# Patient Record
Sex: Male | Born: 1960 | Race: White | Hispanic: No | Marital: Single | Smoking: Current every day smoker
Health system: Southern US, Community
[De-identification: ages and names within clinical notes are randomized; demographics above are authoritative.]

## PROBLEM LIST (undated history)

## (undated) DIAGNOSIS — J449 Chronic obstructive pulmonary disease, unspecified: Secondary | ICD-10-CM

## (undated) DIAGNOSIS — K922 Gastrointestinal hemorrhage, unspecified: Secondary | ICD-10-CM

## (undated) DIAGNOSIS — Z86711 Personal history of pulmonary embolism: Secondary | ICD-10-CM

## (undated) DIAGNOSIS — I340 Nonrheumatic mitral (valve) insufficiency: Secondary | ICD-10-CM

## (undated) DIAGNOSIS — I428 Other cardiomyopathies: Secondary | ICD-10-CM

## (undated) DIAGNOSIS — I482 Chronic atrial fibrillation, unspecified: Secondary | ICD-10-CM

## (undated) DIAGNOSIS — I5022 Chronic systolic (congestive) heart failure: Secondary | ICD-10-CM

## (undated) DIAGNOSIS — I2510 Atherosclerotic heart disease of native coronary artery without angina pectoris: Secondary | ICD-10-CM

## (undated) DIAGNOSIS — I10 Essential (primary) hypertension: Secondary | ICD-10-CM

## (undated) DIAGNOSIS — F1910 Other psychoactive substance abuse, uncomplicated: Secondary | ICD-10-CM

## (undated) DIAGNOSIS — J45909 Unspecified asthma, uncomplicated: Secondary | ICD-10-CM

## (undated) DIAGNOSIS — Z86718 Personal history of other venous thrombosis and embolism: Secondary | ICD-10-CM

## (undated) DIAGNOSIS — E785 Hyperlipidemia, unspecified: Secondary | ICD-10-CM

## (undated) HISTORY — PX: APPENDECTOMY: SHX54

## (undated) HISTORY — PX: HERNIA REPAIR: SHX51

---

## 1997-07-16 ENCOUNTER — Encounter: Admission: RE | Admit: 1997-07-16 | Discharge: 1997-10-14 | Payer: Self-pay | Admitting: Internal Medicine

## 2004-09-21 ENCOUNTER — Emergency Department: Payer: Self-pay | Admitting: Emergency Medicine

## 2004-12-22 ENCOUNTER — Encounter: Payer: Self-pay | Admitting: Orthopedic Surgery

## 2005-01-07 ENCOUNTER — Encounter: Payer: Self-pay | Admitting: Orthopedic Surgery

## 2006-01-04 ENCOUNTER — Ambulatory Visit: Payer: Self-pay | Admitting: Pain Medicine

## 2006-01-17 ENCOUNTER — Ambulatory Visit: Payer: Self-pay | Admitting: Pain Medicine

## 2006-02-08 ENCOUNTER — Ambulatory Visit: Payer: Self-pay | Admitting: Pain Medicine

## 2006-02-13 ENCOUNTER — Ambulatory Visit: Payer: Self-pay | Admitting: Pain Medicine

## 2006-02-20 ENCOUNTER — Ambulatory Visit: Payer: Self-pay | Admitting: Pain Medicine

## 2006-02-21 ENCOUNTER — Ambulatory Visit: Payer: Self-pay | Admitting: Pain Medicine

## 2006-03-07 ENCOUNTER — Ambulatory Visit: Payer: Self-pay | Admitting: Physician Assistant

## 2006-03-21 ENCOUNTER — Ambulatory Visit: Payer: Self-pay | Admitting: Pain Medicine

## 2006-04-10 ENCOUNTER — Ambulatory Visit: Payer: Self-pay | Admitting: Pain Medicine

## 2006-04-20 ENCOUNTER — Ambulatory Visit: Payer: Self-pay | Admitting: Pain Medicine

## 2006-05-08 ENCOUNTER — Ambulatory Visit: Payer: Self-pay | Admitting: Physician Assistant

## 2006-05-23 ENCOUNTER — Ambulatory Visit: Payer: Self-pay | Admitting: Pain Medicine

## 2006-08-16 ENCOUNTER — Emergency Department: Payer: Self-pay

## 2006-08-29 ENCOUNTER — Ambulatory Visit: Payer: Self-pay | Admitting: General Surgery

## 2006-09-04 ENCOUNTER — Ambulatory Visit: Payer: Self-pay | Admitting: General Surgery

## 2010-02-23 ENCOUNTER — Emergency Department: Payer: Self-pay | Admitting: Internal Medicine

## 2011-06-06 ENCOUNTER — Emergency Department: Payer: Self-pay | Admitting: *Deleted

## 2011-06-06 LAB — CBC
MCH: 32.7 pg (ref 26.0–34.0)
MCHC: 33.6 g/dL (ref 32.0–36.0)
Platelet: 275 10*3/uL (ref 150–440)
RDW: 13.6 % (ref 11.5–14.5)

## 2011-06-06 LAB — COMPREHENSIVE METABOLIC PANEL
Alkaline Phosphatase: 62 U/L (ref 50–136)
Anion Gap: 8 (ref 7–16)
Bilirubin,Total: 0.7 mg/dL (ref 0.2–1.0)
Co2: 27 mmol/L (ref 21–32)
EGFR (Non-African Amer.): >60
Glucose: 83 mg/dL (ref 65–99)
Osmolality: 277 (ref 275–301)
Potassium: 4.1 mmol/L (ref 3.5–5.1)
SGOT(AST): 21 U/L (ref 15–37)
Sodium: 139 mmol/L (ref 136–145)
Total Protein: 6.4 g/dL (ref 6.4–8.2)

## 2011-06-06 LAB — CK TOTAL AND CKMB (NOT AT ARMC): CK, Total: 200 U/L (ref 35–232)

## 2011-08-04 DIAGNOSIS — R079 Chest pain, unspecified: Secondary | ICD-10-CM | POA: Insufficient documentation

## 2011-08-05 DIAGNOSIS — F17200 Nicotine dependence, unspecified, uncomplicated: Secondary | ICD-10-CM | POA: Insufficient documentation

## 2011-10-06 DIAGNOSIS — E785 Hyperlipidemia, unspecified: Secondary | ICD-10-CM | POA: Insufficient documentation

## 2011-10-06 DIAGNOSIS — I10 Essential (primary) hypertension: Secondary | ICD-10-CM | POA: Insufficient documentation

## 2011-10-06 DIAGNOSIS — I2510 Atherosclerotic heart disease of native coronary artery without angina pectoris: Secondary | ICD-10-CM | POA: Insufficient documentation

## 2012-07-28 ENCOUNTER — Inpatient Hospital Stay: Payer: Self-pay | Admitting: Family Medicine

## 2012-07-28 LAB — CBC WITH DIFFERENTIAL/PLATELET
Basophil #: 0.1 10*3/uL (ref 0.0–0.1)
Basophil %: 0.7 %
Eosinophil #: 0 10*3/uL (ref 0.0–0.7)
HGB: 15.3 g/dL (ref 13.0–18.0)
Lymphocyte %: 5.2 %
MCHC: 34.7 g/dL (ref 32.0–36.0)
MCV: 99 fL (ref 80–100)
Monocyte %: 7 %
Neutrophil #: 13.6 10*3/uL — ABNORMAL HIGH (ref 1.4–6.5)
Neutrophil %: 87 %
RDW: 13.3 % (ref 11.5–14.5)
WBC: 15.6 10*3/uL — ABNORMAL HIGH (ref 3.8–10.6)

## 2012-07-28 LAB — COMPREHENSIVE METABOLIC PANEL
Alkaline Phosphatase: 92 U/L (ref 50–136)
Bilirubin,Total: 0.8 mg/dL (ref 0.2–1.0)
Calcium, Total: 8.6 mg/dL (ref 8.5–10.1)
Co2: 20 mmol/L — ABNORMAL LOW (ref 21–32)
EGFR (African American): 57 — ABNORMAL LOW
Glucose: 120 mg/dL — ABNORMAL HIGH (ref 65–99)
Osmolality: 281 (ref 275–301)
Potassium: 3.5 mmol/L (ref 3.5–5.1)
SGPT (ALT): 33 U/L (ref 12–78)

## 2012-07-28 LAB — CK TOTAL AND CKMB (NOT AT ARMC)
CK, Total: 97 U/L (ref 35–232)
CK, Total: 82 U/L (ref 35–232)
CK, Total: 67 U/L (ref 35–232)
CK-MB: 0.9 ng/mL (ref 0.5–3.6)
CK-MB: 1 ng/mL (ref 0.5–3.6)

## 2012-07-28 LAB — TROPONIN I
Troponin-I: <0.02 ng/mL
Troponin-I: <0.02 ng/mL

## 2012-07-29 LAB — CBC WITH DIFFERENTIAL/PLATELET
Basophil #: 0 10*3/uL (ref 0.0–0.1)
Eosinophil %: 0 %
Lymphocyte #: 0.7 10*3/uL — ABNORMAL LOW (ref 1.0–3.6)
Lymphocyte %: 6.4 %
RBC: 4.37 10*6/uL — ABNORMAL LOW (ref 4.40–5.90)
RDW: 13.2 % (ref 11.5–14.5)

## 2012-07-29 LAB — BASIC METABOLIC PANEL
BUN: 30 mg/dL — ABNORMAL HIGH (ref 7–18)
Calcium, Total: 9.3 mg/dL (ref 8.5–10.1)
Chloride: 102 mmol/L (ref 98–107)
EGFR (African American): >60
EGFR (Non-African Amer.): >60
Osmolality: 283 (ref 275–301)
Potassium: 3.7 mmol/L (ref 3.5–5.1)

## 2012-07-29 LAB — LIPID PANEL
HDL Cholesterol: 92 mg/dL — ABNORMAL HIGH (ref 40–60)
Triglycerides: 88 mg/dL (ref 0–200)
VLDL Cholesterol, Calc: 18 mg/dL (ref 5–40)

## 2012-09-21 ENCOUNTER — Emergency Department: Payer: Self-pay | Admitting: Emergency Medicine

## 2012-09-21 LAB — CBC
HCT: 44.5 % (ref 40.0–52.0)
MCH: 36 pg — ABNORMAL HIGH (ref 26.0–34.0)
MCV: 102 fL — ABNORMAL HIGH (ref 80–100)
Platelet: 232 10*3/uL (ref 150–440)
RBC: 4.36 10*6/uL — ABNORMAL LOW (ref 4.40–5.90)
RDW: 14.6 % — ABNORMAL HIGH (ref 11.5–14.5)
WBC: 8.5 10*3/uL (ref 3.8–10.6)

## 2012-09-21 LAB — BASIC METABOLIC PANEL
Anion Gap: 4 — ABNORMAL LOW (ref 7–16)
Calcium, Total: 8.3 mg/dL — ABNORMAL LOW (ref 8.5–10.1)
Co2: 37 mmol/L — ABNORMAL HIGH (ref 21–32)
EGFR (African American): >60
Glucose: 119 mg/dL — ABNORMAL HIGH (ref 65–99)

## 2013-04-10 DIAGNOSIS — R0602 Shortness of breath: Secondary | ICD-10-CM | POA: Insufficient documentation

## 2013-04-29 DIAGNOSIS — M545 Low back pain, unspecified: Secondary | ICD-10-CM | POA: Insufficient documentation

## 2013-04-29 DIAGNOSIS — J449 Chronic obstructive pulmonary disease, unspecified: Secondary | ICD-10-CM | POA: Insufficient documentation

## 2014-05-30 NOTE — Discharge Summary (Signed)
PATIENT NAME:  Kirk Cortez, Kirk Cortez MR#:  [id number] DATE OF BIRTH:  06-25-1960  DATE OF ADMISSION:  07/28/2012 DATE OF DISCHARGE:  07/29/2012  ADMISSION DIAGNOSIS: Chronic obstructive pulmonary disease exacerbation.   FINAL DIAGNOSIS:  1.  Chronic obstructive pulmonary disease exacerbation.  2. Congestive heart failure, systolic and diastolic, mixed, compensated. No signs of exacerbation during this hospitalization.  3.  Chest pain, related to cough, musculoskeletal, noncardiac. 4.  Acute renal failure related to dehydration, now resolved.  5. Systemic inflammatory response syndrome with leukocytosis, tachycardia, tachypnea due to chronic obstructive pulmonary disease exacerbation. The patient on Levaquin.  6.  Hypertension, well-controlled.   IMPORTANT RESULTS: Glucose 149, BUN 30, creatinine 1.1 at discharge; 1.59 on admission. LDL 122, cholesterol 232, HDL of 92.   AST is less than 12. Troponin is negative x 3. White blood count on admission 15.6; at discharge 11.4. Hemoglobin 15.3.   ABGs showed a pO2 of 70% on admission, pCO2 of 26, and a pH of 7.47.   D-dimer 0.34.   Echo Doppler, as mentioned above the patient has mixed systolic and diastolic dysfunction. Ejection fraction is 45%, mildly increased global left ventricular systolic function with moderate increase of left ventricular internal cavity size and diastolic filling impairment on relaxation.   CHEST X-RAY: No acute pulmonary edema. No consolidations.   HOSPITAL COURSE: The patient was admitted for treatment of COPD exacerbation. He has a history of being a chronic smoker. He has congestive heart failure, current tobacco use. The patient was admitted 07/28/2012. He is being followed by [facility] for his congestive heart failure, and he has not been compliant with his medications as he keeps running out of them.   The patient reports that he has some right now and he is going to take them. He started wheezing for the past several  days, with a cough and sputum. He had elevation of white blood count, for which he was put on antibiotics and started on nebs and Solu-Medrol. The patient was off the oxygen quickly, and his oxygen saturation was brought above 92% on room air. He had an ABG with a pO2 of 70% on admission, but now that it is better after all of this treatment.   The patient wants to be discharged today. He does not want to stay any longer because he has to go to work. He does not have a veryy extenous job. He works for Scientist, product/process development for a band. He only has to do some connections. No heavy lifting, for which he wants to be discharged today. I told him that I would like to keep him for another day, but the patient seems to be insufferable at this moment, for which we are going to discharge him on Levaquin and a prednisone taper.   MEDICATIONS AT DISCHARGE: Lisinopril 10 mg once a day, furosemide 40 mg twice daily, Coreg 12.5 mg twice daily, prednisone taper, albuterol and ipratropium nebs. The patient has a nebulizer at home, continue 4 times a day for next 5 days, then p.r.n., levofloxacin 500 mg every 24 hours for 12 days, nicotine inhalation kit, given to the patient as well.   Follow up with [facility] in the next 1 to 2 weeks, and PCP at [facility] as well the next couple of weeks.   The patient discharged in good condition.   I spent about 45 minutes on this discharge.    ____________________________ Stilwell Sink, MD rsg:dm D: 07/29/2012 09:13:39 ET T: 07/29/2012 11:36:08 ET JOB#: [id number]  cc: Parlier Sink, MD, <Dictator> Aunesty Tyson America Brown MD ELECTRONICALLY SIGNED 08/08/2012 14:59

## 2014-05-30 NOTE — H&P (Signed)
PATIENT NAME:  Kirk Cortez, Kirk Cortez MR#:  [id number] DATE OF BIRTH:  1960/12/27  DATE OF ADMISSION:  07/28/2012  REFERRING PHYSICIAN: Lurena Joinerebecca L. Lord, MD  PRIMARY CARE PHYSICIAN: Currently having none, but he does [facility].   CHIEF COMPLAINT: Shortness of breath, chest pain.   HISTORY OF PRESENT ILLNESS: This is a 54 year old male with significant past medical history of COPD, CHF, hypertension, tobacco abuse, who presents with complaints of shortness of breath and chest pain. The patient reports he was diagnosed with CHF, where he has been [facility], but he reports the last time he saw the physician was before 8 months. As well, he reports he is noncompliant with medication as he ran out of most of his meds. The patient reported he has been having shortness of breath for the last few days, as well, he started to have cough with productive sputum, green in color. The patient had significant wheezing in ED, had ABG showing hypoxic respiratory failure. The patient's shortness of breath much improved after he received IV Solu-Medrol and nebulizer treatment. As well, the patient has been complaining of chest pain on the left side, nonradiating, describes it as a pressure, tightness quality. Denies any previous episodes of similar chest pain. Currently, reports he is chest pain free after his shortness of breath has improved. The patient's first troponin was negative. His EKG did not have any significant changes from the last one we had on our records. The patient's chest x-ray does not show any infiltrate. He denies any coffee-ground emesis, any melena, any palpitation, any diaphoresis, but has complaints of nausea, vomiting and diarrhea for 1 time earlier this week. As well, the patient was found to have elevated creatinine at 1.59. Reports he has been out of Lasix for 1 day. As well, he has been out of lisinopril for the last 2 weeks. He has been only taking carvedilol.  Hospitalist service was requested to admit the patient for further management and workup of his symptoms.   PAST MEDICAL HISTORY:  1. CHF.  2. COPD.  3. Hypertension.  4. Tobacco abuse.  5. History of noncompliance.   PAST SURGICAL HISTORY: Appendectomy.   SOCIAL HISTORY: The patient smokes 1 to 2 packs of cigarettes every day. Drinks alcohol, 1 pint of rum every other day. No illicit drug use Works as a Technical sales engineermusician.   FAMILY HISTORY: Significant for congestive heart failure in his father who died before 3 years, as well in one of his sisters.   ALLERGIES: No known drug allergies.   HOME MEDICATIONS:  1. Carvedilol 12.5 mg p.o. b.i.d.  2. Lisinopril 10 mg oral daily.  3. Lasix 40 mg oral 2 times a day.   REVIEW OF SYSTEMS:  CONSTITUTIONAL: The patient denies any fever, chills. Complains of generalized weakness and fatigue.  EYES: Denies blurry vision, double vision, pain, inflammation or glaucoma.  ENT: Denies any tinnitus, epistaxis or discharge, ear pain or hearing loss.  RESPIRATORY: Complains of cough, wheezing, COPD, dyspnea and green productive sputum.  CARDIOVASCULAR: Complains of chest pain. Denies edema, arrhythmia, palpitation or syncope.  GASTROINTESTINAL: Complains of nausea, vomiting and diarrhea, 1 episode, currently resolved. Denies abdominal pain, melena, coffee-ground emesis, rectal bleed or jaundice.  GENITOURINARY: Denies dysuria, hematuria or renal colic.  ENDOCRINE: Denies polyuria or polydipsia, heat or cold intolerance.  HEMATOLOGY: Denies anemia, easy bruising or bleeding diathesis.  INTEGUMENTARY: Denies acne, rash or skin lesions.  MUSCULOSKELETAL: Denies any gout, arthritis or cramps.  NEUROLOGIC:  Denies any CVA, TIA, ataxia, dementia, headache or seizures. PSYCHIATRIC: Denies anxiety, insomnia, schizophrenia, nervousness or bipolar disorder.   PHYSICAL EXAMINATION:  VITAL SIGNS: Pulse 102, respiratory rate 28, blood pressure 96/56, saturating 91% on  oxygen.  GENERAL: Well-nourished male, comfortable in bed, in no apparent distress.  HEENT: Head atraumatic, normocephalic. Pupils equal and reactive to light. Pink conjunctivae. Anicteric sclerae. Moist oral mucosa.  NECK: Supple. No thyromegaly. No JVD. No carotid bruits.  CHEST: The patient had fair air entry bilaterally with end-expiratory wheezing. No rales or rhonchi.  CARDIOVASCULAR: S1 and S2 heard. No rubs, murmurs or gallops.  ABDOMEN: Soft, nontender, nondistended. Bowel sounds present.  EXTREMITIES: No edema. No clubbing or cyanosis. Dorsalis pedis pulse +2 bilaterally.  SKIN: Normal skin turgor. Warm and dry.  PSYCHIATRIC: Appropriate affect. Awake, alert x3. Intact judgment and insight.  NEUROLOGIC: Cranial nerves grossly intact. Motor 5 out of 5. Sensory: No deficit.  LYMPHATICS: No cervical lymphadenopathy.  MUSCULOSKELETAL: No joint effusion.   PERTINENT LABORATORY DATA: Glucose 120, BUN 23, creatinine 1.59, sodium 138, potassium 3.5, chloride 104. Troponin less than 0.02. White blood cells 15.6, hemoglobin 15.3, hematocrit 44.2, platelets 236. D-dimer 0.34. ABG showing pH of 7.47, pCO2 of 26, pO2 of 70.   ASSESSMENT AND PLAN:  1. Shortness of breath. This is most likely related to chronic obstructive pulmonary disease exacerbation. Unlikely that this patient is having an acute exacerbation of his congestive heart failure as he does not appear to be in any volume overload. As well, his D-dimer is negative, so he is in acute hypoxic respiratory failure secondary to his chronic obstructive pulmonary disease exacerbation.  2. Chronic obstructive pulmonary disease exacerbation. Will start the patient on IV Solu-Medrol and nebulizers around the clock every 4 hours, DuoNebs, and p.r.n. albuterol every 2 hours, and given the fact that he is having productive sputum, green in color, will start him on levofloxacin.  3. Chest pain. Given that currently the patient is chest pain free without  any intervention and has negative troponins, no EKG changes and was given 324 mg of aspirin in the ED, will continue to cycle his cardiac enzymes, will have him on a telemetry monitor and will check a 2-D echo and lipid panel.  4. Acute renal failure. Will hold the patient's lisinopril, will hold his Lasix. Will continue him on gentle hydration. This is most likely prerenal, especially as the patient had an episode of vomiting and diarrhea causing his volume depletion.  5. Leukocytosis. The patient's chest x-ray does not have any infiltrate. Will check urinalysis.  6. Hypertension. Blood pressure actually is on the lower side, so will continue to hold his Lasix and lisinopril and continue him only on Coreg.  7. Tobacco abuse. The patient was counseled and will be started on NicoDerm patch.  8. Alcohol abuse. The patient will be started CIWA protocol.  9. Congestive heart failure, appears to be compensated. Unclear if it is diastolic or systolic. Will check 2-D echo.  10. Deep vein thrombosis prophylaxis. Subcutaneous heparin.   CODE STATUS: Full code.   TOTAL TIME SPENT ON ADMISSION AND PATIENT CARE: 55 minutes.   ____________________________ Starleen Arms, MD dse:OSi D: 07/28/2012 08:41:06 ET T: 07/28/2012 09:02:28 ET JOB#: [id number]  cc: Starleen Arms, MD, <Dictator> Jourdan Durbin Teena Irani MD ELECTRONICALLY SIGNED 07/29/2012 6:15

## 2014-09-01 ENCOUNTER — Other Ambulatory Visit: Payer: Self-pay

## 2014-09-01 ENCOUNTER — Telehealth: Payer: Self-pay

## 2014-09-01 ENCOUNTER — Emergency Department: Payer: Self-pay

## 2014-09-01 ENCOUNTER — Emergency Department
Admission: EM | Admit: 2014-09-01 | Discharge: 2014-09-01 | Disposition: A | Discharge status: Home / Self Care | Payer: Self-pay | Attending: Emergency Medicine | Admitting: Emergency Medicine

## 2014-09-01 ENCOUNTER — Encounter: Payer: Self-pay | Admitting: Emergency Medicine

## 2014-09-01 DIAGNOSIS — J441 Chronic obstructive pulmonary disease with (acute) exacerbation: Secondary | ICD-10-CM | POA: Insufficient documentation

## 2014-09-01 DIAGNOSIS — R079 Chest pain, unspecified: Secondary | ICD-10-CM

## 2014-09-01 DIAGNOSIS — Z720 Tobacco use: Secondary | ICD-10-CM | POA: Insufficient documentation

## 2014-09-01 DIAGNOSIS — I10 Essential (primary) hypertension: Secondary | ICD-10-CM | POA: Insufficient documentation

## 2014-09-01 DIAGNOSIS — I509 Heart failure, unspecified: Secondary | ICD-10-CM | POA: Insufficient documentation

## 2014-09-01 HISTORY — DX: Essential (primary) hypertension: I10

## 2014-09-01 HISTORY — DX: Chronic obstructive pulmonary disease, unspecified: J44.9

## 2014-09-01 LAB — BASIC METABOLIC PANEL
Anion gap: 6 (ref 5–15)
BUN: 11 mg/dL (ref 6–20)
CALCIUM: 8.9 mg/dL (ref 8.9–10.3)
CHLORIDE: 105 mmol/L (ref 101–111)
CO2: 25 mmol/L (ref 22–32)
Creatinine, Ser: 0.83 mg/dL (ref 0.61–1.24)
GFR calc non Af Amer: >60 mL/min (ref >60)
GLUCOSE: 141 mg/dL — AB (ref 65–99)
Potassium: 3.9 mmol/L (ref 3.5–5.1)
SODIUM: 136 mmol/L (ref 135–145)

## 2014-09-01 LAB — CBC
HCT: 44 % (ref 40.0–52.0)
Hemoglobin: 14.4 g/dL (ref 13.0–18.0)
MCH: 29.4 pg (ref 26.0–34.0)
MCHC: 32.7 g/dL (ref 32.0–36.0)
MCV: 89.7 fL (ref 80.0–100.0)
Platelets: 265 10*3/uL (ref 150–440)
RBC: 4.9 MIL/uL (ref 4.40–5.90)
RDW: 16.8 % — ABNORMAL HIGH (ref 11.5–14.5)
WBC: 9.7 10*3/uL (ref 3.8–10.6)

## 2014-09-01 LAB — TROPONIN I: Troponin I: 0.03 ng/mL (ref <0.031)

## 2014-09-01 MED ORDER — PREDNISONE 20 MG PO TABS
60.0000 mg | ORAL_TABLET | Freq: Once | ORAL | Status: AC
  Administered 2014-09-01: 60 mg via ORAL
  Filled 2014-09-01: qty 3

## 2014-09-01 MED ORDER — FUROSEMIDE 40 MG PO TABS: 40.0000 mg | ORAL_TABLET | Freq: Every day | ORAL | Status: DC

## 2014-09-01 MED ORDER — ALBUTEROL SULFATE HFA 108 (90 BASE) MCG/ACT IN AERS: 2.0000 | INHALATION_SPRAY | Freq: Four times a day (QID) | RESPIRATORY_TRACT | Status: DC | PRN

## 2014-09-01 MED ORDER — ASPIRIN 81 MG PO CHEW
324.0000 mg | CHEWABLE_TABLET | Freq: Once | ORAL | Status: AC
  Administered 2014-09-01: 324 mg via ORAL
  Filled 2014-09-01: qty 4

## 2014-09-01 MED ORDER — PREDNISONE 20 MG PO TABS: 60.0000 mg | ORAL_TABLET | Freq: Every day | ORAL | Status: AC

## 2014-09-01 MED ORDER — ALBUTEROL SULFATE (2.5 MG/3ML) 0.083% IN NEBU
2.5000 mg | INHALATION_SOLUTION | Freq: Once | RESPIRATORY_TRACT | Status: AC
  Administered 2014-09-01: 2.5 mg via RESPIRATORY_TRACT
  Filled 2014-09-01 (×2): qty 3

## 2014-09-01 MED ORDER — FUROSEMIDE 20 MG PO TABS
60.0000 mg | ORAL_TABLET | Freq: Once | ORAL | Status: DC
  Filled 2014-09-01: qty 3

## 2014-09-01 MED ORDER — ASPIRIN EC 81 MG PO TBEC: 81.0000 mg | DELAYED_RELEASE_TABLET | Freq: Every day | ORAL | Status: AC

## 2014-09-01 MED ORDER — AZITHROMYCIN 500 MG PO TABS: ORAL_TABLET | ORAL | Status: DC

## 2014-09-01 MED ORDER — FUROSEMIDE 40 MG PO TABS
ORAL_TABLET | ORAL | Status: AC
  Administered 2014-09-01: 60 mg
  Filled 2014-09-01: qty 2

## 2014-09-01 NOTE — ED Notes (Signed)
Pt to ed with c/o chest pain that started last Thursday,  Pt states hx of chest pain, states sob associated with chest pain and dizziness. Pt states worse over the weekend and last night, reports hx of MI and +smoker.

## 2014-09-01 NOTE — ED Provider Notes (Signed)
[facility] Emergency Department Provider Note  ____________________________________________  Time seen: Approximately 2:36 PM  I have reviewed the triage vital signs and the nursing notes.   HISTORY  Chief Complaint Chest Pain and Shortness of Breath    HPI Kirk Cortez is a 54 y.o. male with COPD, hypertension and MI who presents today with shortness of breath 4 days. The patient says that he ran out of his Lasix 4 days ago and ever since then has been having worsening shortness of breath. He is also had several episodes of chest pain each lasting several hours which she said field like a squeezing to the left side of the chest. There was no radiation. There is no shortness of breath associated with these episodes. They were nonexertional. There is been no nausea or vomiting. The patient says that he has been taking BC powder but no aspirin daily. He has not seen a cardiologist in over a year. Says that he was previously seen at [facility] in [place] but would not like to follow up there because he is no longer close to that area. He is still smoking but denies drinking at this time. Has not taking any aspirin today. Says that he has in the past been on 40-60 mg Lasix per day.Denies any cough or fever.   Past Medical History  Diagnosis Date  . COPD (chronic obstructive pulmonary disease)   . Hypertension   . Heart attack   . Heart murmur     There are no active problems to display for this patient.   Past Surgical History  Procedure Laterality Date  . Appendectomy    . Hernia repair      No current outpatient prescriptions on file.  Allergies Review of patient's allergies indicates no known allergies.  Family History  Problem Relation Age of Onset  . Cancer Mother   . Heart attack Father     Social History History  Substance Use Topics  . Smoking status: Current Every Day Smoker  . Smokeless tobacco: Not on file  . Alcohol Use: No     Review of Systems Constitutional: No fever/chills Eyes: No visual changes. ENT: No sore throat. Cardiovascular: As above  Respiratory: As above Gastrointestinal: No abdominal pain.  No nausea, no vomiting.  No diarrhea.  No constipation. Genitourinary: Negative for dysuria. Musculoskeletal: Negative for back pain. Skin: Negative for rash. Neurological: Negative for headaches, focal weakness or numbness.  10-point ROS otherwise negative.  ____________________________________________   PHYSICAL EXAM:  VITAL SIGNS: ED Triage Vitals  Enc Vitals Group     BP 09/01/14 1042 116/77 mmHg     Pulse Rate 09/01/14 1042 79     Resp 09/01/14 1042 18     Temp 09/01/14 1042 97.8 F (36.6 C)     Temp Source 09/01/14 1042 Oral     SpO2 09/01/14 1042 96 %     Weight 09/01/14 1042 200 lb (90.719 kg)     Height 09/01/14 1042 5\' 10"  (1.778 m)     Head Cir --      Peak Flow --      Pain Score 09/01/14 1043 6     Pain Loc --      Pain Edu? --      Excl. in GC? --     Constitutional: Alert and oriented. Well appearing and in no acute distress. Eyes: Conjunctivae are normal. PERRL. EOMI. Head: Atraumatic. Nose: No congestion/rhinnorhea. Mouth/Throat: Mucous membranes are moist.  Oropharynx  non-erythematous. Neck: No stridor.   Cardiovascular: Normal rate, regular rhythm. Grossly normal heart sounds.  Good peripheral circulation. Respiratory: Normal respiratory effort.  No retractions. Diffuse wheezing but with good air movement. No retractions or respiratory distress.  Gastrointestinal: Soft and nontender. No distention. No abdominal bruits. No CVA tenderness. Musculoskeletal: No lower extremity tenderness nor edema.  No joint effusions. Neurologic:  Normal speech and language. No gross focal neurologic deficits are appreciated. No gait instability. Skin:  Skin is warm, dry and intact. No rash noted. Psychiatric: Mood and affect are normal. Speech and behavior are  normal.  ____________________________________________   LABS (all labs ordered are listed, but only abnormal results are displayed)  Labs Reviewed  BASIC METABOLIC PANEL - Abnormal; Notable for the following:    Glucose, Bld 141 (*)    All other components within normal limits  CBC - Abnormal; Notable for the following:    RDW 16.8 (*)    All other components within normal limits  TROPONIN I   ____________________________________________  EKG  ED ECG REPORT I, Arelia Longest, the attending physician, personally viewed and interpreted this ECG.   Date: 09/01/2014  EKG Time: 1039  Rate: 81  Rhythm: normal sinus rhythm  Axis: Normal axis.  Intervals:none  ST&T Change: T wave inversions in 1 and aVL. EKG is unchanged from June and April 2014 and 13 respectively. ____________________________________________  RADIOLOGY  Chest x-ray with cardiac enlargement with vascular congestion. Mild right lower lobe atelectasis or infiltrate and small right effusion. ____________________________________________   PROCEDURES    ____________________________________________   INITIAL IMPRESSION / ASSESSMENT AND PLAN / ED COURSE  Pertinent labs & imaging results that were available during my care of the patient were reviewed by me and considered in my medical decision making (see chart for details).  ----------------------------------------- 2:41 PM on 09/01/2014 -----------------------------------------  After albuterol nebulization the patient is no longer wheezing. Says that his breathing easier. We'll discharge home with prescription for Lasix, aspirin. Will hold on [place] all secondary to blood pressure in the 1 teens systolic. Discussed case with Dr. Kary Kos who says that his office will contact the patient to schedule a follow-up appointment. Patient understands the plan and is comfortable and willing to comply. Patient with baseline troponin of 0.03 which is unchanged  from his previous visit. Has had chest pain for the last 4 days without abnormal troponin or EKG change from previous. Believe the patient is safe for follow-up as an outpatient at this time. Also patient is well-appearing and in no acute distress at this time. ____________________________________________   FINAL CLINICAL IMPRESSION(S) / ED DIAGNOSES  Acute COPD exacerbation. Acute congestive heart failure. Acute chest pain. Initial visit.    Myrna Blazer, MD 09/01/14 [phone]

## 2014-09-01 NOTE — Telephone Encounter (Signed)
-----   Message from Iran Ouch, MD sent at 09/01/2014  2:31 PM EDT ----- ER referral for CHF.

## 2014-09-01 NOTE — Telephone Encounter (Signed)
l mom to call and schedule ED f/u

## 2014-09-17 ENCOUNTER — Ambulatory Visit: Payer: Self-pay

## 2014-09-22 ENCOUNTER — Telehealth: Payer: Self-pay

## 2014-09-22 NOTE — Telephone Encounter (Signed)
Request from Disability Determination Services, sent to [employer] on 09/23/2014 .  

## 2014-10-07 NOTE — Telephone Encounter (Signed)
This encounter was created in error - please disregard.

## 2014-10-08 ENCOUNTER — Telehealth: Payer: Self-pay

## 2014-10-08 NOTE — Telephone Encounter (Signed)
.  himroi   

## 2014-10-08 NOTE — Telephone Encounter (Signed)
Received records request from Disability [employer] , forwarded to [employer] for processing. ° °

## 2014-10-10 ENCOUNTER — Ambulatory Visit: Payer: Self-pay | Admitting: Cardiovascular Disease

## 2014-11-10 ENCOUNTER — Other Ambulatory Visit: Payer: Self-pay | Admitting: Family Medicine

## 2014-11-10 ENCOUNTER — Ambulatory Visit
Admission: RE | Admit: 2014-11-10 | Discharge: 2014-11-10 | Disposition: A | Discharge status: Home / Self Care | Payer: Disability Insurance | Source: Ambulatory Visit | Attending: Family Medicine | Admitting: Family Medicine

## 2014-11-10 DIAGNOSIS — I70208 Unspecified atherosclerosis of native arteries of extremities, other extremity: Secondary | ICD-10-CM | POA: Diagnosis not present

## 2014-11-10 DIAGNOSIS — M47816 Spondylosis without myelopathy or radiculopathy, lumbar region: Secondary | ICD-10-CM | POA: Diagnosis not present

## 2014-11-10 DIAGNOSIS — M539 Dorsopathy, unspecified: Secondary | ICD-10-CM | POA: Diagnosis present

## 2014-12-08 NOTE — Telephone Encounter (Signed)
This encounter was created in error - please disregard.

## 2015-01-14 DIAGNOSIS — F418 Other specified anxiety disorders: Secondary | ICD-10-CM | POA: Insufficient documentation

## 2015-03-30 ENCOUNTER — Other Ambulatory Visit: Payer: Self-pay | Admitting: Thoracic Diseases

## 2015-03-30 DIAGNOSIS — I509 Heart failure, unspecified: Secondary | ICD-10-CM

## 2015-04-14 ENCOUNTER — Ambulatory Visit: Payer: Disability Insurance

## 2015-05-21 ENCOUNTER — Ambulatory Visit: Payer: Disability Insurance | Attending: Thoracic Diseases

## 2015-05-21 DIAGNOSIS — J439 Emphysema, unspecified: Secondary | ICD-10-CM | POA: Insufficient documentation

## 2015-05-21 DIAGNOSIS — I509 Heart failure, unspecified: Secondary | ICD-10-CM

## 2015-06-12 DIAGNOSIS — I82403 Acute embolism and thrombosis of unspecified deep veins of lower extremity, bilateral: Secondary | ICD-10-CM | POA: Insufficient documentation

## 2015-06-12 DIAGNOSIS — I2699 Other pulmonary embolism without acute cor pulmonale: Secondary | ICD-10-CM | POA: Insufficient documentation

## 2015-08-16 ENCOUNTER — Emergency Department
Admission: EM | Admit: 2015-08-16 | Discharge: 2015-08-16 | DRG: 310 | Discharge status: Against Medical Advice | Payer: Medicaid Other | Attending: Internal Medicine | Admitting: Internal Medicine

## 2015-08-16 ENCOUNTER — Emergency Department: Payer: Medicaid Other

## 2015-08-16 ENCOUNTER — Encounter: Payer: Self-pay | Admitting: Emergency Medicine

## 2015-08-16 DIAGNOSIS — Z9889 Other specified postprocedural states: Secondary | ICD-10-CM | POA: Insufficient documentation

## 2015-08-16 DIAGNOSIS — F17200 Nicotine dependence, unspecified, uncomplicated: Secondary | ICD-10-CM | POA: Diagnosis present

## 2015-08-16 DIAGNOSIS — Z5320 Procedure and treatment not carried out because of patient's decision for unspecified reasons: Secondary | ICD-10-CM

## 2015-08-16 DIAGNOSIS — Z5329 Procedure and treatment not carried out because of patient's decision for other reasons: Secondary | ICD-10-CM

## 2015-08-16 DIAGNOSIS — I517 Cardiomegaly: Secondary | ICD-10-CM | POA: Insufficient documentation

## 2015-08-16 DIAGNOSIS — Z7982 Long term (current) use of aspirin: Secondary | ICD-10-CM

## 2015-08-16 DIAGNOSIS — J449 Chronic obstructive pulmonary disease, unspecified: Secondary | ICD-10-CM | POA: Diagnosis present

## 2015-08-16 DIAGNOSIS — Z79899 Other long term (current) drug therapy: Secondary | ICD-10-CM

## 2015-08-16 DIAGNOSIS — I509 Heart failure, unspecified: Secondary | ICD-10-CM

## 2015-08-16 DIAGNOSIS — I10 Essential (primary) hypertension: Secondary | ICD-10-CM | POA: Insufficient documentation

## 2015-08-16 DIAGNOSIS — R791 Abnormal coagulation profile: Secondary | ICD-10-CM

## 2015-08-16 DIAGNOSIS — R079 Chest pain, unspecified: Secondary | ICD-10-CM | POA: Insufficient documentation

## 2015-08-16 DIAGNOSIS — I4891 Unspecified atrial fibrillation: Secondary | ICD-10-CM

## 2015-08-16 LAB — PROTIME-INR
INR: 1.17
PROTHROMBIN TIME: 15.1 s — AB (ref 11.4–15.0)

## 2015-08-16 LAB — CBC WITH DIFFERENTIAL/PLATELET
BASOS ABS: 0.1 10*3/uL (ref 0–0.1)
Basophils Relative: 1 %
EOS ABS: 0.2 10*3/uL (ref 0–0.7)
EOS PCT: 2 %
HCT: 43.1 % (ref 40.0–52.0)
Hemoglobin: 13.9 g/dL (ref 13.0–18.0)
Lymphocytes Relative: 17 %
Lymphs Abs: 1.6 10*3/uL (ref 1.0–3.6)
MCH: 27.8 pg (ref 26.0–34.0)
MCHC: 32.3 g/dL (ref 32.0–36.0)
MCV: 86.1 fL (ref 80.0–100.0)
Monocytes Absolute: 0.9 10*3/uL (ref 0.2–1.0)
Monocytes Relative: 9 %
Neutro Abs: 7.1 10*3/uL — ABNORMAL HIGH (ref 1.4–6.5)
Neutrophils Relative %: 71 %
PLATELETS: 204 10*3/uL (ref 150–440)
RBC: 5 MIL/uL (ref 4.40–5.90)
RDW: 23 % — ABNORMAL HIGH (ref 11.5–14.5)
WBC: 9.9 10*3/uL (ref 3.8–10.6)

## 2015-08-16 LAB — COMPREHENSIVE METABOLIC PANEL
ALT: 18 U/L (ref 17–63)
AST: 18 U/L (ref 15–41)
Albumin: 3.7 g/dL (ref 3.5–5.0)
Alkaline Phosphatase: 74 U/L (ref 38–126)
Anion gap: 8 (ref 5–15)
BUN: 41 mg/dL — AB (ref 6–20)
CO2: 27 mmol/L (ref 22–32)
CREATININE: 1.04 mg/dL (ref 0.61–1.24)
Calcium: 8.9 mg/dL (ref 8.9–10.3)
Chloride: 100 mmol/L — ABNORMAL LOW (ref 101–111)
GFR calc non Af Amer: >60 mL/min (ref >60)
Glucose, Bld: 114 mg/dL — ABNORMAL HIGH (ref 65–99)
Potassium: 3.3 mmol/L — ABNORMAL LOW (ref 3.5–5.1)
SODIUM: 135 mmol/L (ref 135–145)
Total Bilirubin: 0.8 mg/dL (ref 0.3–1.2)
Total Protein: 7.2 g/dL (ref 6.5–8.1)

## 2015-08-16 LAB — TROPONIN I: Troponin I: 0.04 ng/mL (ref <0.03)

## 2015-08-16 LAB — BRAIN NATRIURETIC PEPTIDE: B Natriuretic Peptide: 1103 pg/mL — ABNORMAL HIGH (ref 0.0–100.0)

## 2015-08-16 LAB — ETHANOL: Alcohol, Ethyl (B): <5 mg/dL (ref <5)

## 2015-08-16 MED ORDER — METOPROLOL SUCCINATE ER 50 MG PO TB24
100.0000 mg | ORAL_TABLET | Freq: Every day | ORAL | Status: DC
Start: 2015-08-16 — End: 2015-08-16

## 2015-08-16 MED ORDER — DILTIAZEM HCL 25 MG/5ML IV SOLN
INTRAVENOUS | Status: DC
Start: 2015-08-16 — End: 2015-08-16
  Filled 2015-08-16: qty 5

## 2015-08-16 MED ORDER — DILTIAZEM HCL 25 MG/5ML IV SOLN
10.0000 mg | Freq: Once | INTRAVENOUS | Status: AC
  Administered 2015-08-16: 10 mg via INTRAVENOUS

## 2015-08-16 MED ORDER — FUROSEMIDE 10 MG/ML IJ SOLN
40.0000 mg | Freq: Once | INTRAMUSCULAR | Status: AC
  Administered 2015-08-16: 40 mg via INTRAVENOUS
  Filled 2015-08-16: qty 4
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
## 2015-08-16 NOTE — ED Notes (Signed)
Lab called to report elevated troponin 0.04; notified Dr Verdie ShireSung-acknowledged
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
## 2015-08-16 NOTE — ED Notes (Signed)
Pt back from CT; sister at bedside; HR 106

## 2015-08-16 NOTE — ED Notes (Signed)
Patient transported to CT 

## 2015-08-16 NOTE — Discharge Instructions (Signed)
You are leaving AGAINST MEDICAL ADVICE. Your heart rate was too fast which required 2 doses of IV medications to control your heart rate. Additionally, your heart enzyme was elevated and you are experiencing a CHF exacerbation. Please call your cardiologist first thing Monday morning for earliest appointment. Return to the ER for recurrent or worsening symptoms, chest pain, shortness of breath or other concerns.  Atrial Fibrillation Atrial fibrillation is a type of irregular or rapid heartbeat (arrhythmia). In atrial fibrillation, the heart quivers continuously in a chaotic pattern. This occurs when parts of the heart receive disorganized signals that make the heart unable to pump blood normally. This can increase the risk for stroke, heart failure, and other heart-related conditions. There are different types of atrial fibrillation, including:  Paroxysmal atrial fibrillation. This type starts suddenly, and it usually stops on its own shortly after it starts.  Persistent atrial fibrillation. This type often lasts longer than a week. It may stop on its own or with treatment.  Long-lasting persistent atrial fibrillation. This type lasts longer than 12 months.  Permanent atrial fibrillation. This type does not go away. Talk with your health care provider to learn about the type of atrial fibrillation that you have. CAUSES This condition is caused by some heart-related conditions or procedures, including:  A heart attack.  Coronary artery disease.  Heart failure.  Heart valve conditions.  High blood pressure.  Inflammation of the sac that surrounds the heart (pericarditis).  Heart surgery.  Certain heart rhythm disorders, such as Wolf-Parkinson-White syndrome. Other causes include:  Pneumonia.  Obstructive sleep apnea.  Blockage of an artery in the lungs (pulmonary embolism, or PE).  Lung cancer.  Chronic lung disease.  Thyroid problems, especially if the thyroid is overactive  (hyperthyroidism).  Caffeine.  Excessive alcohol use or illegal drug use.  Use of some medicines, including certain decongestants and diet pills. Sometimes, the cause cannot be found. RISK FACTORS This condition is more likely to develop in:  People who are older in age.  People who smoke.  People who have diabetes mellitus.  People who are overweight (obese).  Athletes who exercise vigorously. SYMPTOMS Symptoms of this condition include:  A feeling that your heart is beating rapidly or irregularly.  A feeling of discomfort or pain in your chest.  Shortness of breath.  Sudden light-headedness or weakness.  Getting tired easily during exercise. In some cases, there are no symptoms. DIAGNOSIS Your health care provider may be able to detect atrial fibrillation when taking your pulse. If detected, this condition may be diagnosed with:  An electrocardiogram (ECG).  A Holter monitor test that records your heartbeat patterns over a 24-hour period.  Transthoracic echocardiogram (TTE) to evaluate how blood flows through your heart.  Transesophageal echocardiogram (TEE) to view more detailed images of your heart.  A stress test.  Imaging tests, such as a CT scan or chest X-ray.  Blood tests. TREATMENT The main goals of treatment are to prevent blood clots from forming and to keep your heart beating at a normal rate and rhythm. The type of treatment that you receive depends on many factors, such as your underlying medical conditions and how you feel when you are experiencing atrial fibrillation. This condition may be treated with:  Medicine to slow down the heart rate, bring the heart's rhythm back to normal, or prevent clots from forming.  Electrical cardioversion. This is a procedure that resets your heart's rhythm by delivering a controlled, low-energy shock to the heart through your  skin.  Different types of ablation, such as catheter ablation, catheter ablation with  pacemaker, or surgical ablation. These procedures destroy the heart tissues that send abnormal signals. When the pacemaker is used, it is placed under your skin to help your heart beat in a regular rhythm. HOME CARE INSTRUCTIONS  Take over-the counter and prescription medicines only as told by your health care provider.  If your health care provider prescribed a blood-thinning medicine (anticoagulant), take it exactly as told. Taking too much blood-thinning medicine can cause bleeding. If you do not take enough blood-thinning medicine, you will not have the protection that you need against stroke and other problems.  Do not use tobacco products, including cigarettes, chewing tobacco, and e-cigarettes. If you need help quitting, ask your health care provider.  If you have obstructive sleep apnea, manage your condition as told by your health care provider.  Do not drink alcohol.  Do not drink beverages that contain caffeine, such as coffee, soda, and tea.  Maintain a healthy weight. Do not use diet pills unless your health care provider approves. Diet pills may make heart problems worse.  Follow diet instructions as told by your health care provider.  Exercise regularly as told by your health care provider.  Keep all follow-up visits as told by your health care provider. This is important. PREVENTION  Avoid drinking beverages that contain caffeine or alcohol.  Avoid certain medicines, especially medicines that are used for breathing problems.  Avoid certain herbs and herbal medicines, such as those that contain ephedra or ginseng.  Do not use illegal drugs, such as cocaine and amphetamines.  Do not smoke.  Manage your high blood pressure. SEEK MEDICAL CARE IF:  You notice a change in the rate, rhythm, or strength of your heartbeat.  You are taking an anticoagulant and you notice increased bruising.  You tire more easily when you exercise or exert yourself. SEEK IMMEDIATE  MEDICAL CARE IF:  You have chest pain, abdominal pain, sweating, or weakness.  You feel nauseous.  You notice blood in your vomit, bowel movement, or urine.  You have shortness of breath.  You suddenly have swollen feet and ankles.  You feel dizzy.  You have sudden weakness or numbness of the face, arm, or leg, especially on one side of the body.  You have trouble speaking, trouble understanding, or both (aphasia).  Your face or your eyelid droops on one side. These symptoms may represent a serious problem that is an emergency. Do not wait to see if the symptoms will go away. Get medical help right away. Call your local emergency services (911 in the [place] Do not drive yourself to the hospital.   This information is not intended to replace advice given to you by your health care provider. Make sure you discuss any questions you have with your health care provider.   Document Released: 01/24/2005 Document Revised: 10/15/2014 Document Reviewed: 05/21/2014 Elsevier Interactive Patient Education 2016 [employer].  General Assault Assault includes any behavior or physical attack--whether it is on purpose or not--that results in injury to another person, damage to property, or both. This also includes assault that has not yet happened, but is planned to happen. Threats of assault may be physical, verbal, or written. They may be said or sent by:  Mail.  E-mail.  Text.  Social media.  Fax. The threats may be direct, implied, or understood. WHAT ARE THE DIFFERENT FORMS OF ASSAULT? Forms of assault include:  Physically assaulting a person.  This includes physical threats to inflict physical harm as well as:  Slapping.  Hitting.  Poking.  Kicking.  Punching.  Pushing.  Sexually assaulting a person. Sexual assault is any sexual activity that a person is forced, threatened, or coerced to participate in. It may or may not involve physical contact with the person who is  assaulting you. You are sexually assaulted if you are forced to have sexual contact of any kind.  Damaging or destroying a person's assistive equipment, such as glasses, canes, or walkers.  Throwing or hitting objects.  Using or displaying a weapon to harm or threaten someone.  Using or displaying an object that appears to be a weapon in a threatening manner.  Using greater physical size or strength to intimidate someone.  Making intimidating or threatening gestures.  Bullying.  Hazing.  Using language that is intimidating, threatening, hostile, or abusive.  Stalking.  Restraining someone with force. WHAT SHOULD I DO IF I EXPERIENCE ASSAULT?  Report assaults, threats, and stalking to the police. Call your local emergency services (911 in the [place] if you are in immediate danger or you need medical help.  You can work with a Clinical research associate or an advocate to get legal protection against someone who has assaulted you or threatened you with assault. Protection includes restraining orders and private addresses. Crimes against you, such as assault, can also be prosecuted through the courts. Laws will vary depending on where you live.   This information is not intended to replace advice given to you by your health care provider. Make sure you discuss any questions you have with your health care provider.   Document Released: 01/24/2005 Document Revised: 02/14/2014 Document Reviewed: 10/11/2013 Elsevier Interactive Patient Education 2016 [employer]  Heart Failure Heart failure is a condition in which the heart has trouble pumping blood. This means your heart does not pump blood efficiently for your body to work well. In some cases of heart failure, fluid may back up into your lungs or you may have swelling (edema) in your lower legs. Heart failure is usually a long-term (chronic) condition. It is important for you to take good care of yourself and follow your health care provider's treatment  plan. CAUSES  Some health conditions can cause heart failure. Those health conditions include:  High blood pressure (hypertension). Hypertension causes the heart muscle to work harder than normal. When pressure in the blood vessels is high, the heart needs to pump (contract) with more force in order to circulate blood throughout the body. High blood pressure eventually causes the heart to become stiff and weak.  Coronary artery disease (CAD). CAD is the buildup of cholesterol and fat (plaque) in the arteries of the heart. The blockage in the arteries deprives the heart muscle of oxygen and blood. This can cause chest pain and may lead to a heart attack. High blood pressure can also contribute to CAD.  Heart attack (myocardial infarction). A heart attack occurs when one or more arteries in the heart become blocked. The loss of oxygen damages the muscle tissue of the heart. When this happens, part of the heart muscle dies. The injured tissue does not contract as well and weakens the heart's ability to pump blood.  Abnormal heart valves. When the heart valves do not open and close properly, it can cause heart failure. This makes the heart muscle pump harder to keep the blood flowing.  Heart muscle disease (cardiomyopathy or myocarditis). Heart muscle disease is damage to the heart muscle  from a variety of causes. These can include drug or alcohol abuse, infections, or unknown reasons. These can increase the risk of heart failure.  Lung disease. Lung disease makes the heart work harder because the lungs do not work properly. This can cause a strain on the heart, leading it to fail.  Diabetes. Diabetes increases the risk of heart failure. High blood sugar contributes to high fat (lipid) levels in the blood. Diabetes can also cause slow damage to tiny blood vessels that carry important nutrients to the heart muscle. When the heart does not get enough oxygen and food, it can cause the heart to become weak  and stiff. This leads to a heart that does not contract efficiently.  Other conditions can contribute to heart failure. These include abnormal heart rhythms, thyroid problems, and low blood counts (anemia). Certain unhealthy behaviors can increase the risk of heart failure, including:  Being overweight.  Smoking or chewing tobacco.  Eating foods high in fat and cholesterol.  Abusing illicit drugs or alcohol.  Lacking physical activity. SYMPTOMS  Heart failure symptoms may vary and can be hard to detect. Symptoms may include:  Shortness of breath with activity, such as climbing stairs.  Persistent cough.  Swelling of the feet, ankles, legs, or abdomen.  Unexplained weight gain.  Difficulty breathing when lying flat (orthopnea).  Waking from sleep because of the need to sit up and get more air.  Rapid heartbeat.  Fatigue and loss of energy.  Feeling light-headed, dizzy, or close to fainting.  Loss of appetite.  Nausea.  Increased urination during the night (nocturia). DIAGNOSIS  A diagnosis of heart failure is based on your history, symptoms, physical examination, and diagnostic tests. Diagnostic tests for heart failure may include:  Echocardiography.  Electrocardiography.  Chest X-ray.  Blood tests.  Exercise stress test.  Cardiac angiography.  Radionuclide scans. TREATMENT  Treatment is aimed at managing the symptoms of heart failure. Medicines, behavioral changes, or surgical intervention may be necessary to treat heart failure.  Medicines to help treat heart failure may include:  Angiotensin-converting enzyme (ACE) inhibitors. This type of medicine blocks the effects of a blood protein called angiotensin-converting enzyme. ACE inhibitors relax (dilate) the blood vessels and help lower blood pressure.  Angiotensin receptor blockers (ARBs). This type of medicine blocks the actions of a blood protein called angiotensin. Angiotensin receptor blockers  dilate the blood vessels and help lower blood pressure.  Water pills (diuretics). Diuretics cause the kidneys to remove salt and water from the blood. The extra fluid is removed through urination. This loss of extra fluid lowers the volume of blood the heart pumps.  Beta blockers. These prevent the heart from beating too fast and improve heart muscle strength.  Digitalis. This increases the force of the heartbeat.  Healthy behavior changes include:  Obtaining and maintaining a healthy weight.  Stopping smoking or chewing tobacco.  Eating heart-healthy foods.  Limiting or avoiding alcohol.  Stopping illicit drug use.  Physical activity as directed by your health care provider.  Surgical treatment for heart failure may include:  A procedure to open blocked arteries, repair damaged heart valves, or remove damaged heart muscle tissue.  A pacemaker to improve heart muscle function and control certain abnormal heart rhythms.  An internal cardioverter defibrillator to treat certain serious abnormal heart rhythms.  A left ventricular assist device (LVAD) to assist the pumping ability of the heart. HOME CARE INSTRUCTIONS   Take medicines only as directed by your health care provider. Medicines  are important in reducing the workload of your heart, slowing the progression of heart failure, and improving your symptoms.  Do not stop taking your medicine unless directed by your health care provider.  Do not skip any dose of medicine.  Refill your prescriptions before you run out of medicine. Your medicines are needed every day.  Engage in moderate physical activity if directed by your health care provider. Moderate physical activity can benefit some people. The elderly and people with severe heart failure should consult with a health care provider for physical activity recommendations.  Eat heart-healthy foods. Food choices should be free of trans fat and low in saturated fat,  cholesterol, and salt (sodium). Healthy choices include fresh or frozen fruits and vegetables, fish, lean meats, legumes, fat-free or low-fat dairy products, and whole grain or high fiber foods. Talk to a dietitian to learn more about heart-healthy foods.  Limit sodium if directed by your health care provider. Sodium restriction may reduce symptoms of heart failure in some people. Talk to a dietitian to learn more about heart-healthy seasonings.  Use healthy cooking methods. Healthy cooking methods include roasting, grilling, broiling, baking, poaching, steaming, or stir-frying. Talk to a dietitian to learn more about healthy cooking methods.  Limit fluids if directed by your health care provider. Fluid restriction may reduce symptoms of heart failure in some people.  Weigh yourself every day. Daily weights are important in the early recognition of excess fluid. You should weigh yourself every morning after you urinate and before you eat breakfast. Wear the same amount of clothing each time you weigh yourself. Record your daily weight. Provide your health care provider with your weight record.  Monitor and record your blood pressure if directed by your health care provider.  Check your pulse if directed by your health care provider.  Lose weight if directed by your health care provider. Weight loss may reduce symptoms of heart failure in some people.  Stop smoking or chewing tobacco. Nicotine makes your heart work harder by causing your blood vessels to constrict. Do not use nicotine gum or patches before talking to your health care provider.  Keep all follow-up visits as directed by your health care provider. This is important.  Limit alcohol intake to no more than 1 drink per day for nonpregnant women and 2 drinks per day for men. One drink equals 12 ounces of beer, 5 ounces of wine, or 1 ounces of hard liquor. Drinking more than that is harmful to your heart. Tell your health care provider if  you drink alcohol several times a week. Talk with your health care provider about whether alcohol is safe for you. If your heart has already been damaged by alcohol or you have severe heart failure, drinking alcohol should be stopped completely.  Stop illicit drug use.  Stay up-to-date with immunizations. It is especially important to prevent respiratory infections through current pneumococcal and influenza immunizations.  Manage other health conditions such as hypertension, diabetes, thyroid disease, or abnormal heart rhythms as directed by your health care provider.  Learn to manage stress.  Plan rest periods when fatigued.  Learn strategies to manage high temperatures. If the weather is extremely hot:  Avoid vigorous physical activity.  Use air conditioning or fans or seek a cooler location.  Avoid caffeine and alcohol.  Wear loose-fitting, lightweight, and light-colored clothing.  Learn strategies to manage cold temperatures. If the weather is extremely cold:  Avoid vigorous physical activity.  Layer clothes.  Wear mittens or  gloves, a hat, and a scarf when going outside.  Avoid alcohol.  Obtain ongoing education and support as needed.  Participate in or seek rehabilitation as needed to maintain or improve independence and quality of life. SEEK MEDICAL CARE IF:   You have a rapid weight gain.  You have increasing shortness of breath that is unusual for you.  You are unable to participate in your usual physical activities.  You tire easily.  You cough more than normal, especially with physical activity.  You have any or more swelling in areas such as your hands, feet, ankles, or abdomen.  You are unable to sleep because it is hard to breathe.  You feel like your heart is beating fast (palpitations).  You become dizzy or light-headed upon standing up. SEEK IMMEDIATE MEDICAL CARE IF:   You have difficulty breathing.  There is a change in mental status such  as decreased alertness or difficulty with concentration.  You have a pain or discomfort in your chest.  You have an episode of fainting (syncope). MAKE SURE YOU:   Understand these instructions.  Will watch your condition.  Will get help right away if you are not doing well or get worse.   This information is not intended to replace advice given to you by your health care provider. Make sure you discuss any questions you have with your health care provider.   Document Released: 01/24/2005 Document Revised: 06/10/2014 Document Reviewed: 02/24/2012 Elsevier Interactive Patient Education [employer]
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
## 2015-09-15 ENCOUNTER — Inpatient Hospital Stay
Admission: EM | Admit: 2015-09-15 | Discharge: 2015-09-15 | DRG: 190 | Discharge status: Against Medical Advice | Payer: Medicaid Other | Attending: Internal Medicine | Admitting: Internal Medicine

## 2015-09-15 ENCOUNTER — Encounter: Payer: Self-pay | Admitting: Internal Medicine

## 2015-09-15 ENCOUNTER — Emergency Department: Payer: Medicaid Other

## 2015-09-15 DIAGNOSIS — Z7901 Long term (current) use of anticoagulants: Secondary | ICD-10-CM | POA: Diagnosis not present

## 2015-09-15 DIAGNOSIS — N289 Disorder of kidney and ureter, unspecified: Secondary | ICD-10-CM | POA: Diagnosis present

## 2015-09-15 DIAGNOSIS — Z8249 Family history of ischemic heart disease and other diseases of the circulatory system: Secondary | ICD-10-CM | POA: Diagnosis not present

## 2015-09-15 DIAGNOSIS — I110 Hypertensive heart disease with heart failure: Secondary | ICD-10-CM | POA: Diagnosis present

## 2015-09-15 DIAGNOSIS — R778 Other specified abnormalities of plasma proteins: Secondary | ICD-10-CM | POA: Diagnosis present

## 2015-09-15 DIAGNOSIS — R079 Chest pain, unspecified: Secondary | ICD-10-CM

## 2015-09-15 DIAGNOSIS — E876 Hypokalemia: Secondary | ICD-10-CM | POA: Diagnosis present

## 2015-09-15 DIAGNOSIS — F17200 Nicotine dependence, unspecified, uncomplicated: Secondary | ICD-10-CM | POA: Diagnosis present

## 2015-09-15 DIAGNOSIS — E873 Alkalosis: Secondary | ICD-10-CM | POA: Diagnosis present

## 2015-09-15 DIAGNOSIS — I509 Heart failure, unspecified: Secondary | ICD-10-CM | POA: Diagnosis present

## 2015-09-15 DIAGNOSIS — N19 Unspecified kidney failure: Secondary | ICD-10-CM

## 2015-09-15 DIAGNOSIS — I482 Chronic atrial fibrillation, unspecified: Secondary | ICD-10-CM

## 2015-09-15 DIAGNOSIS — J440 Chronic obstructive pulmonary disease with acute lower respiratory infection: Secondary | ICD-10-CM | POA: Diagnosis present

## 2015-09-15 DIAGNOSIS — J189 Pneumonia, unspecified organism: Secondary | ICD-10-CM | POA: Diagnosis present

## 2015-09-15 DIAGNOSIS — Z86711 Personal history of pulmonary embolism: Secondary | ICD-10-CM

## 2015-09-15 DIAGNOSIS — R0902 Hypoxemia: Secondary | ICD-10-CM

## 2015-09-15 DIAGNOSIS — I214 Non-ST elevation (NSTEMI) myocardial infarction: Secondary | ICD-10-CM

## 2015-09-15 DIAGNOSIS — I252 Old myocardial infarction: Secondary | ICD-10-CM | POA: Diagnosis not present

## 2015-09-15 LAB — CBC
HCT: 34.6 % — ABNORMAL LOW (ref 40.0–52.0)
HEMATOCRIT: 35.7 % — AB (ref 40.0–52.0)
HEMOGLOBIN: 11.8 g/dL — AB (ref 13.0–18.0)
HEMOGLOBIN: 11.5 g/dL — AB (ref 13.0–18.0)
MCH: 29.3 pg (ref 26.0–34.0)
MCH: 29.5 pg (ref 26.0–34.0)
MCHC: 33 g/dL (ref 32.0–36.0)
MCHC: 33.2 g/dL (ref 32.0–36.0)
MCV: 88.8 fL (ref 80.0–100.0)
MCV: 88.6 fL (ref 80.0–100.0)
Platelets: 310 10*3/uL (ref 150–440)
Platelets: 335 10*3/uL (ref 150–440)
RBC: 4.02 MIL/uL — ABNORMAL LOW (ref 4.40–5.90)
RBC: 3.9 MIL/uL — AB (ref 4.40–5.90)
RDW: 21.4 % — ABNORMAL HIGH (ref 11.5–14.5)
RDW: 21.5 % — ABNORMAL HIGH (ref 11.5–14.5)
WBC: 10.1 10*3/uL (ref 3.8–10.6)
WBC: 10.4 10*3/uL (ref 3.8–10.6)

## 2015-09-15 LAB — BLOOD GAS, ARTERIAL
ALLENS TEST (PASS/FAIL): POSITIVE — AB
Acid-Base Excess: 34.3 mmol/L — ABNORMAL HIGH (ref 0.0–3.0)
BICARBONATE: 60.9 meq/L — AB (ref 21.0–28.0)
FIO2: 0.28
O2 SAT: 94 %
PATIENT TEMPERATURE: 37
pCO2 arterial: 65 mmHg — ABNORMAL HIGH (ref 32.0–48.0)
pH, Arterial: 7.58 — ABNORMAL HIGH (ref 7.350–7.450)
pO2, Arterial: 60 mmHg — ABNORMAL LOW (ref 83.0–108.0)

## 2015-09-15 LAB — BASIC METABOLIC PANEL
ANION GAP: 16 — AB (ref 5–15)
ANION GAP: 17 — AB (ref 5–15)
BUN: 70 mg/dL — ABNORMAL HIGH (ref 6–20)
BUN: 74 mg/dL — ABNORMAL HIGH (ref 6–20)
CALCIUM: 8.7 mg/dL — AB (ref 8.9–10.3)
CHLORIDE: 74 mmol/L — AB (ref 101–111)
CO2: 47 mmol/L — ABNORMAL HIGH (ref 22–32)
CO2: 42 mmol/L — AB (ref 22–32)
Calcium: 8.5 mg/dL — ABNORMAL LOW (ref 8.9–10.3)
Chloride: 73 mmol/L — ABNORMAL LOW (ref 101–111)
Creatinine, Ser: 2.31 mg/dL — ABNORMAL HIGH (ref 0.61–1.24)
Creatinine, Ser: 2.45 mg/dL — ABNORMAL HIGH (ref 0.61–1.24)
GFR calc non Af Amer: 28 mL/min — ABNORMAL LOW (ref >60)
GFR, EST AFRICAN AMERICAN: 35 mL/min — AB (ref >60)
GFR, EST AFRICAN AMERICAN: 32 mL/min — AB (ref >60)
GFR, EST NON AFRICAN AMERICAN: 30 mL/min — AB (ref >60)
Glucose, Bld: 141 mg/dL — ABNORMAL HIGH (ref 65–99)
Glucose, Bld: 155 mg/dL — ABNORMAL HIGH (ref 65–99)
POTASSIUM: 2.9 mmol/L — AB (ref 3.5–5.1)
Potassium: 3.2 mmol/L — ABNORMAL LOW (ref 3.5–5.1)
SODIUM: 133 mmol/L — AB (ref 135–145)
Sodium: 136 mmol/L (ref 135–145)

## 2015-09-15 LAB — PROTIME-INR
INR: 3.35
Prothrombin Time: 34.7 seconds — ABNORMAL HIGH (ref 11.4–15.2)

## 2015-09-15 LAB — URINE DRUG SCREEN, QUALITATIVE (ARMC ONLY)
Amphetamines, Ur Screen: NOT DETECTED
BARBITURATES, UR SCREEN: NOT DETECTED
Benzodiazepine, Ur Scrn: NOT DETECTED
COCAINE METABOLITE, UR ~~LOC~~: NOT DETECTED
Cannabinoid 50 Ng, Ur ~~LOC~~: NOT DETECTED
MDMA (ECSTASY) UR SCREEN: NOT DETECTED
METHADONE SCREEN, URINE: NOT DETECTED
OPIATE, UR SCREEN: POSITIVE — AB
Phencyclidine (PCP) Ur S: NOT DETECTED
TRICYCLIC, UR SCREEN: NOT DETECTED

## 2015-09-15 LAB — TROPONIN I
TROPONIN I: 0.11 ng/mL — AB (ref <0.03)
Troponin I: 0.09 ng/mL (ref <0.03)

## 2015-09-15 LAB — BRAIN NATRIURETIC PEPTIDE: B Natriuretic Peptide: 1877 pg/mL — ABNORMAL HIGH (ref 0.0–100.0)

## 2015-09-15 LAB — LACTIC ACID, PLASMA
LACTIC ACID, VENOUS: 1.2 mmol/L (ref 0.5–1.9)
Lactic Acid, Venous: 3.6 mmol/L (ref 0.5–1.9)

## 2015-09-15 MED ORDER — IPRATROPIUM-ALBUTEROL 0.5-2.5 (3) MG/3ML IN SOLN
3.0000 mL | Freq: Four times a day (QID) | RESPIRATORY_TRACT | Status: DC
  Administered 2015-09-15 (×2): 3 mL via RESPIRATORY_TRACT
  Filled 2015-09-15 (×2): qty 3

## 2015-09-15 MED ORDER — SENNOSIDES-DOCUSATE SODIUM 8.6-50 MG PO TABS: 1.0000 | ORAL_TABLET | Freq: Every evening | ORAL | Status: DC | PRN

## 2015-09-15 MED ORDER — DEXTROSE 5 % IV SOLN
1.0000 g | INTRAVENOUS | Status: DC
  Administered 2015-09-15: 1 g via INTRAVENOUS
  Filled 2015-09-15: qty 10

## 2015-09-15 MED ORDER — DEXTROSE 5 % IV SOLN
500.0000 mg | Freq: Once | INTRAVENOUS | Status: AC
  Administered 2015-09-15: 500 mg via INTRAVENOUS
  Filled 2015-09-15: qty 500

## 2015-09-15 MED ORDER — NICOTINE 21 MG/24HR TD PT24
21.0000 mg | MEDICATED_PATCH | Freq: Once | TRANSDERMAL | Status: DC
  Administered 2015-09-15: 21 mg via TRANSDERMAL
  Filled 2015-09-15: qty 1

## 2015-09-15 MED ORDER — FUROSEMIDE 10 MG/ML IJ SOLN
INTRAMUSCULAR | Status: AC
  Administered 2015-09-15: 40 mg via INTRAVENOUS
  Filled 2015-09-15: qty 4

## 2015-09-15 MED ORDER — SODIUM CHLORIDE 0.9% FLUSH: 3.0000 mL | Freq: Two times a day (BID) | INTRAVENOUS | Status: DC

## 2015-09-15 MED ORDER — METHYLPREDNISOLONE SODIUM SUCC 125 MG IJ SOLR
INTRAMUSCULAR | Status: AC
Start: 2015-09-15 — End: 2015-09-15
  Administered 2015-09-15: 125 mg via INTRAVENOUS
  Filled 2015-09-15: qty 2

## 2015-09-15 MED ORDER — ALBUTEROL SULFATE (2.5 MG/3ML) 0.083% IN NEBU: 3.0000 mL | INHALATION_SOLUTION | Freq: Four times a day (QID) | RESPIRATORY_TRACT | Status: DC | PRN

## 2015-09-15 MED ORDER — SODIUM CHLORIDE 0.9% FLUSH: 3.0000 mL | INTRAVENOUS | Status: DC | PRN

## 2015-09-15 MED ORDER — ASPIRIN EC 81 MG PO TBEC: 81.0000 mg | DELAYED_RELEASE_TABLET | Freq: Every day | ORAL | Status: DC

## 2015-09-15 MED ORDER — FUROSEMIDE 10 MG/ML IJ SOLN
40.0000 mg | Freq: Two times a day (BID) | INTRAMUSCULAR | Status: DC
  Administered 2015-09-15: 40 mg via INTRAVENOUS

## 2015-09-15 MED ORDER — ACETAMINOPHEN 325 MG PO TABS: 650.0000 mg | ORAL_TABLET | Freq: Four times a day (QID) | ORAL | Status: DC | PRN

## 2015-09-15 MED ORDER — METHYLPREDNISOLONE SODIUM SUCC 125 MG IJ SOLR
125.0000 mg | Freq: Once | INTRAMUSCULAR | Status: AC
  Administered 2015-09-15: 125 mg via INTRAVENOUS

## 2015-09-15 MED ORDER — SODIUM CHLORIDE 0.9 % IV BOLUS (SEPSIS)
1000.0000 mL | Freq: Once | INTRAVENOUS | Status: AC
  Administered 2015-09-15: 1000 mL via INTRAVENOUS

## 2015-09-15 MED ORDER — ONDANSETRON HCL 4 MG PO TABS: 4.0000 mg | ORAL_TABLET | Freq: Four times a day (QID) | ORAL | Status: DC | PRN

## 2015-09-15 MED ORDER — POTASSIUM CHLORIDE CRYS ER 20 MEQ PO TBCR
40.0000 meq | EXTENDED_RELEASE_TABLET | Freq: Once | ORAL | Status: AC
  Administered 2015-09-15: 40 meq via ORAL
  Filled 2015-09-15: qty 2

## 2015-09-15 MED ORDER — DEXTROSE 5 % IV SOLN: 500.0000 mg | INTRAVENOUS | Status: DC

## 2015-09-15 MED ORDER — DEXTROSE 5 % IV SOLN
500.0000 mg | INTRAVENOUS | Status: DC
  Filled 2015-09-15: qty 500

## 2015-09-15 MED ORDER — WARFARIN SODIUM 5 MG PO TABS: 5.0000 mg | ORAL_TABLET | Freq: Every day | ORAL | Status: DC

## 2015-09-15 MED ORDER — ACETAMINOPHEN 650 MG RE SUPP: 650.0000 mg | Freq: Four times a day (QID) | RECTAL | Status: DC | PRN

## 2015-09-15 MED ORDER — CETYLPYRIDINIUM CHLORIDE 0.05 % MT LIQD: 7.0000 mL | Freq: Two times a day (BID) | OROMUCOSAL | Status: DC

## 2015-09-15 MED ORDER — SODIUM CHLORIDE 0.9 % IV SOLN: 250.0000 mL | INTRAVENOUS | Status: DC | PRN

## 2015-09-15 MED ORDER — POTASSIUM CHLORIDE CRYS ER 20 MEQ PO TBCR: 40.0000 meq | EXTENDED_RELEASE_TABLET | Freq: Once | ORAL | Status: DC

## 2015-09-15 MED ORDER — ONDANSETRON HCL 4 MG/2ML IJ SOLN: 4.0000 mg | Freq: Four times a day (QID) | INTRAMUSCULAR | Status: DC | PRN
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
## 2015-09-15 NOTE — ED Triage Notes (Addendum)
Patient ambulatory to triage with steady gait, without difficulty or distress noted, smells of cigarette smoke; pt reports left sided CP since yesterday, nonradiating, "throbbing", [facility]; st hx afib; pt also reports a puppy scratched him on left hand yesterday (skin tear noted with scant bleeding; gauze dressing applied)

## 2015-09-15 NOTE — Progress Notes (Signed)
Found patient sitting on curb. States "I didn't feel like anyone cared"  Encouraged patient to return to unit. [facility]" Patient stated he had a ride. Friend arrived, patient got into car and left.

## 2015-09-15 NOTE — Progress Notes (Signed)
Final progress note  I was notified by the nurse that the patient left in his gown was on the bed. We called for him overhead but he did not return to the room. Nursing staff found him outside and the IV was taken out and he refused to sign the AMA papers. I never saw the patient. He left without me seeing him.  Dr. Alford Highlandichard Rolinda Impson

## 2015-09-15 NOTE — ED Notes (Signed)
Pt alert, talking to staff, unaware and unable to state what happened.

## 2015-09-15 NOTE — Progress Notes (Signed)
INR on admission 3.35. Will reschedule warfarin to start tomorrow PM per conversation with hospitalist. Fulton ReekMatt Consepcion Utt, PharmD, BCPS  09/15/15

## 2015-09-15 NOTE — Progress Notes (Signed)
CCMD called this RN stating that telemetry leads were off patient. Went to check on patient to replace leads - room and bathroom were empty, telemetry box with leads and gown were laying on bed, with empty patient belongings bag. Searched for patient. Found him sitting on curb at visitors entrance, patient refused to come back inside said he was leaving - did not give clear reasons as to why, but per night shift nurse he had tried to leave the ED AMA. Requested patient to come back inside so he could sign AMA form and have his IV taken out - patient again refused, and pulled out his IV by himself. Dr. Renae GlossWieting notified.

## 2015-09-15 NOTE — ED Notes (Signed)
RN and EDT in room getting pt ready to transfer to 2A when pt asked to go to bathroom. Pt ambulatory to bathroom, starts to sway, seat provided for pt to sit down. Pt stands after few minutes, assisted by RN and EDT and starts to sway again, pt provided with seat. Once pt in chair, pt starts to shake, eyes rolled back into head, pts color turned to dusky shade, pts entire body locked up, legs straight out with head and back extended. Pt placed on stretcher provided by RN's and EDT that responded to room for help. Non-re breather applied, pt placed back onto monitor, HR 120 bpm, BP 112/76.

## 2015-09-15 NOTE — Progress Notes (Signed)
Patient arrived to 2A Room 232. Patient denies pain and all questions answered. Patient oriented to unit and Fall Safety Plan signed. Skin assessment completed with Birder Robsoniffany M RN; skin tear noted to left posterior hand and dressing changed. A&Ox4, VSS, and A-fib on verified tele box #[id number]. Nursing staff will continue to monitor for any changes in patient status. Lamonte RicherKara A Vinayak Bobier, RN

## 2015-09-15 NOTE — ED Provider Notes (Signed)
[facility] Emergency Department Provider Note   ____________________________________________   First MD Initiated Contact with Patient 09/15/15 [phone]     (approximate)  I have reviewed the triage vital signs and the nursing notes.   HISTORY  Chief Complaint Chest Pain    HPI Kirk Cortez is a 55 y.o. male who presents to the ED from home with a chief complaint of chest pain and shortness of breath.Patient has a history of atrial fibrillation on warfarin, CHF, COPD not on home oxygen, CAD who complains of a several day history of nonproductive cough, chest pain and shortness of breath. Also presents with a scratch to the back of his left hand from his puppy. Tetanus is up-to-date. Also complains of fever and chills. Denies abdominal pain, nausea, vomiting, diarrhea. Denies recent travel or trauma. Nothing makes his symptoms better or worse.   Past Medical History:  Diagnosis Date  . A-fib (HCC)   . CHF (congestive heart failure) (HCC)   . COPD (chronic obstructive pulmonary disease) (HCC)   . Heart attack (HCC)   . Heart murmur   . Hypertension     Patient Active Problem List   Diagnosis Date Noted  . Community acquired pneumonia 09/15/2015  . CAP (community acquired pneumonia) 09/15/2015  . CHF (congestive heart failure) (HCC) 08/16/2015    Past Surgical History:  Procedure Laterality Date  . APPENDECTOMY    . HERNIA REPAIR      Prior to Admission medications   Medication Sig Start Date End Date Taking? Authorizing Provider  albuterol (PROVENTIL HFA;VENTOLIN HFA) 108 (90 BASE) MCG/ACT inhaler Inhale 2 puffs into the lungs every 6 (six) hours as needed for wheezing or shortness of breath. 09/01/14   Myrna Blazer, MD  azithromycin (ZITHROMAX) 500 MG tablet 500 mg PO first day 250 mg days 2-5 09/01/14   Myrna Blazer, MD  furosemide (LASIX) 40 MG tablet Take 1 tablet (40 mg total) by mouth daily. 09/01/14 09/01/15  Myrna Blazer, MD    Allergies Review of patient's allergies indicates no known allergies.  Family History  Problem Relation Age of Onset  . Cancer Mother   . Heart attack Father     Social History Social History  Substance Use Topics  . Smoking status: Current Every Day Smoker  . Smokeless tobacco: Never Used  . Alcohol use No    Review of Systems  Constitutional: Positive for fever/chills. Eyes: No visual changes. ENT: No sore throat. Cardiovascular: Positive for chest pain. Respiratory: Positive for shortness of breath. Gastrointestinal: No abdominal pain.  No nausea, no vomiting.  No diarrhea.  No constipation. Genitourinary: Negative for dysuria. Musculoskeletal: Negative for back pain. Skin: Negative for rash. Neurological: Negative for headaches, focal weakness or numbness.  10-point ROS otherwise negative.  ____________________________________________   PHYSICAL EXAM:  VITAL SIGNS: ED Triage Vitals  Enc Vitals Group     BP 09/15/15 0056 (!) 157/130     Pulse Rate 09/15/15 0056 74     Resp 09/15/15 0056 20     Temp 09/15/15 0056 97.5 F (36.4 C)     Temp Source 09/15/15 0056 Oral     SpO2 09/15/15 0056 97 %     Weight 09/15/15 0056 194 lb (88 kg)     Height 09/15/15 0056  (1.778 m)     Head Circumference --      Peak Flow --      Pain Score 09/15/15 0054 7  Pain Loc --      Pain Edu? --      Excl. in GC? --     Constitutional: Alert and oriented. Uncomfortable appearing and in moderate acute distress. Eyes: Conjunctivae are normal. PERRL. EOMI. Head: Atraumatic. Nose: No congestion/rhinnorhea. Mouth/Throat: Mucous membranes are moist.  Oropharynx non-erythematous. Neck: No stridor.   Cardiovascular: Tachycardic rate, irregular rhythm. Grossly normal heart sounds.  Good peripheral circulation. Respiratory: Increased respiratory effort.  No retractions. Lungs with rhonchi and wheezing. Gastrointestinal: Soft and nontender. No  distention. No abdominal bruits. No CVA tenderness. Musculoskeletal: No lower extremity tenderness. 1+ BLE edema.  No joint effusions. Neurologic:  Normal speech and language. No gross focal neurologic deficits are appreciated.  Skin:  Skin is warm, dry and intact. No rash noted. Psychiatric: Mood and affect are normal. Speech and behavior are normal.  ____________________________________________   LABS (all labs ordered are listed, but only abnormal results are displayed)  Labs Reviewed  BASIC METABOLIC PANEL - Abnormal; Notable for the following:       Result Value   Potassium 2.9 (*)    Chloride 73 (*)    CO2 47 (*)    Glucose, Bld 141 (*)    BUN 70 (*)    Creatinine, Ser 2.31 (*)    Calcium 8.7 (*)    GFR calc non Af Amer 30 (*)    GFR calc Af Amer 35 (*)    Anion gap 16 (*)    All other components within normal limits  CBC - Abnormal; Notable for the following:    RBC 4.02 (*)    Hemoglobin 11.8 (*)    HCT 35.7 (*)    RDW 21.4 (*)    All other components within normal limits  TROPONIN I - Abnormal; Notable for the following:    Troponin I 0.11 (*)    All other components within normal limits  BLOOD GAS, ARTERIAL - Abnormal; Notable for the following:    pH, Arterial 7.58 (*)    pCO2 arterial 65 (*)    pO2, Arterial 60 (*)    Bicarbonate 60.9 (*)    Acid-Base Excess 34.3 (*)    Allens test (pass/fail) POSITIVE (*)    All other components within normal limits  PROTIME-INR - Abnormal; Notable for the following:    Prothrombin Time 34.7 (*)    All other components within normal limits  BRAIN NATRIURETIC PEPTIDE - Abnormal; Notable for the following:    B Natriuretic Peptide 1,877.0 (*)    All other components within normal limits  CULTURE, BLOOD (ROUTINE X 2)  CULTURE, BLOOD (ROUTINE X 2)  LACTIC ACID, PLASMA  LACTIC ACID, PLASMA  URINE DRUG SCREEN, QUALITATIVE ([facility] ONLY)  TROPONIN I  TROPONIN I  TROPONIN I  BASIC METABOLIC PANEL  CBC    ____________________________________________  EKG  ED ECG REPORT I, Lener Ventresca J, the attending physician, personally viewed and interpreted this ECG.   Date: 09/15/2015  EKG Time: 0055  Rate: 100  Rhythm: atrial fibrillation, rate 100  Axis: Normal  Intervals:none  ST&T Change: Nonspecific  ____________________________________________  RADIOLOGY  Chest 2 view (viewed by me, interpreted per Dr. Cherly Hensenhang): 1. Patchy right basilar airspace opacity is compatible with pneumonia. 2. Mild vascular congestion and cardiomegaly. ____________________________________________   PROCEDURES  Procedure(s) performed: None  Procedures  Critical Care performed: Yes, see critical care note(s)   CRITICAL CARE Performed by: Irean HongSUNG,Jonhatan Hearty J   Total critical care time: 30 minutes  Critical care time was exclusive  of separately billable procedures and treating other patients.  Critical care was necessary to treat or prevent imminent or life-threatening deterioration.  Critical care was time spent personally by me on the following activities: development of treatment plan with patient and/or surrogate as well as nursing, discussions with consultants, evaluation of patient's response to treatment, examination of patient, obtaining history from patient or surrogate, ordering and performing treatments and interventions, ordering and review of laboratory studies, ordering and review of radiographic studies, pulse oximetry and re-evaluation of patient's condition.  ____________________________________________   INITIAL IMPRESSION / ASSESSMENT AND PLAN / ED COURSE  Pertinent labs & imaging results that were available during my care of the patient were reviewed by me and considered in my medical decision making (see chart for details).  55 year old male with a history of atrial fibrillation on warfarin, COPD, CHF who presents with fever, cough, chest pain, shortness of breath. Review of records  reveal that patient was diagnosed with pulmonary embolism in May 2017. Will check INR. Patient is hypoxic, acute renal failure likely secondary to Lasix use. Elevated troponin, pneumonia seen on chest x-ray. Code sepsis was not initiated because patient was not febrile, altered, tachycardic, tachypneic or hypotensive initially.   Clinical Course  Comment By Time  ABG noted, especially elevated bicarbonates. This is likely secondary to acute kidney injury. Will infuse IV fluids. IV antibiotics ordered for community-acquired pneumonia. Discussed with hospitalist to evaluate patient in the emergency department for admission. Irean Hong, MD 08/08 [phone]  Patient had an episode while going to the restroom which appeared to be a stiffening-like appearance. There was no tonic-clonic movements. He became cyanotic but quickly turned around once he was placed in the stretcher and nonrebreather oxygen applied. Cocaine crack pipes were found in his medication bag. They will be destroyed by nursing staff. Irean Hong, MD 08/08 0510     ____________________________________________   FINAL CLINICAL IMPRESSION(S) / ED DIAGNOSES  Final diagnoses:  Chest pain, unspecified chest pain type  NSTEMI (non-ST elevated myocardial infarction) (HCC)  CAP (community acquired pneumonia)  Chronic atrial fibrillation (HCC)  Hypoxia  Renal failure  Hypokalemia  Alkalosis      NEW MEDICATIONS STARTED DURING THIS VISIT:  Current Discharge Medication List       Note:  This document was prepared using Dragon voice recognition software and may include unintentional dictation errors.    Irean Hong, MD 09/15/15 [phone]

## 2015-09-15 NOTE — Progress Notes (Signed)
Pharmacy Antibiotic Note  Kirk DiesDavid Cortez is a 55 y.o. male admitted on 09/15/2015 with pneumonia.  Pharmacy has been consulted for ceftriaxone dosing.  Plan: Ceftriaxone 1 gram q 24 hours ordered by MD. OK for current parameters.  Height: 5\' 10"  (177.8 cm) Weight: 194 lb (88 kg) IBW/kg (Calculated) : 73  Temp (24hrs), Avg:97.5 F (36.4 C), Min:97.5 F (36.4 C), Max:97.5 F (36.4 C)   Recent Labs Lab 09/15/15 0102 09/15/15 0218  WBC 10.1  --   CREATININE 2.31*  --   LATICACIDVEN  --  1.2    Estimated Creatinine Clearance: 40.4 mL/min (by C-G formula based on SCr of 2.31 mg/dL).    No Known Allergies  Antimicrobials this admission: ceftriaxone  >>  azithromycin  >>   Dose adjustments this admission:   Microbiology results: 8/8 BCx: pending   8/8/ CXR: R basilar opacity  Thank you for allowing pharmacy to be a part of this patient's care.  Dartanyon Frankowski S 09/15/2015 4:41 AM

## 2015-09-15 NOTE — H&P (Signed)
[facility] - [place] at [facility]   PATIENT NAME: Kirk Cortez    MR#:  [id number]  DATE OF BIRTH:  [birth year]  DATE OF ADMISSION:  09/15/2015  PRIMARY CARE PHYSICIAN: [facility]   REQUESTING/REFERRING PHYSICIAN:   CHIEF COMPLAINT:   Chief Complaint  Patient presents with  . Chest Pain    HISTORY OF PRESENT ILLNESS: Kirk Cortez  is a 55 y.o. male with a known history of Chronic atrial fibrillation on Coumadin, congestive heart failure, COPD, hypertension presented to the emergency room with shortness of breath for 2 weeks and chest pain. Shortness of breath has been worsening for the last 2 weeks he also has noticed increased swelling in both the legs. Patient says he has been retaining fluid. Patient also complains of chest pain whenever he takes a deep breath that spent bothering him for the last 5-6 days. Has cough which is productive of yellowish phlegm. Low-grade fever and chills noted. No history of recent travel or sick contacts at home. Patient was worked up in the emergency room was found to have pneumonia and was given IV Rocephin and Zithromax antibiotics. Patient on anticoagulation with oral Coumadin and is therapeutic. Patient takes oral Lasix for diuresis. Hospitalist service was consulted for further care of the patient.  PAST MEDICAL HISTORY:   Past Medical History:  Diagnosis Date  . A-fib (HCC)   . CHF (congestive heart failure) (HCC)   . COPD (chronic obstructive pulmonary disease) (HCC)   . Heart attack (HCC)   . Heart murmur   . Hypertension     PAST SURGICAL HISTORY: Past Surgical History:  Procedure Laterality Date  . APPENDECTOMY    . HERNIA REPAIR      SOCIAL HISTORY:  Social History  Substance Use Topics  . Smoking status: Current Every Day Smoker  . Smokeless tobacco: Never Used  . Alcohol use No    FAMILY HISTORY:  Family History  Problem Relation Age of Onset  . Cancer Mother   . Heart attack Father      DRUG ALLERGIES: No Known Allergies  REVIEW OF SYSTEMS:   CONSTITUTIONAL:  Fever noted, has weakness.  EYES: No blurred or double vision.  EARS, NOSE, AND THROAT: No tinnitus or ear pain.  RESPIRATORY: Has cough, shortness of breath,no wheezing or hemoptysis.  CARDIOVASCULAR: No chest pain, orthopnea, has edema.  GASTROINTESTINAL: No nausea, vomiting, diarrhea or abdominal pain.  GENITOURINARY: No dysuria, hematuria.  ENDOCRINE: No polyuria, nocturia,  HEMATOLOGY: No anemia, easy bruising or bleeding SKIN: No rash or lesion. MUSCULOSKELETAL: No joint pain or arthritis.   NEUROLOGIC: No tingling, numbness, weakness.  PSYCHIATRY: No anxiety or depression.   MEDICATIONS AT HOME:  Prior to Admission medications   Medication Sig Start Date End Date Taking? Authorizing Provider  albuterol (PROVENTIL HFA;VENTOLIN HFA) 108 (90 BASE) MCG/ACT inhaler Inhale 2 puffs into the lungs every 6 (six) hours as needed for wheezing or shortness of breath. 09/01/14   Myrna Blazer, MD  azithromycin (ZITHROMAX) 500 MG tablet 500 mg PO first day 250 mg days 2-5 09/01/14   Myrna Blazer, MD  furosemide (LASIX) 40 MG tablet Take 1 tablet (40 mg total) by mouth daily. 09/01/14 09/01/15  Myrna Blazer, MD      PHYSICAL EXAMINATION:   VITAL SIGNS: Blood pressure (!) 87/69, pulse (!) 107, temperature 97.5 F (36.4 C), temperature source Oral, resp. rate 19, height 5\' 10"  (1.778 m), weight 88 kg (194 lb), SpO2 92 %.  GENERAL:  55 y.o.-year-old patient lying in the bed with no acute distress.  EYES: Pupils equal, round, reactive to light and accommodation. No scleral icterus. Extraocular muscles intact.  HEENT: Head atraumatic, normocephalic. Oropharynx and nasopharynx clear.  NECK:  Supple, no jugular venous distention. No thyroid enlargement, no tenderness.  LUNGS: Decreased breath sounds bilaterally, no wheezing, rales heard in right lung,no rhonchi or crepitation. No use of  accessory muscles of respiration.  CARDIOVASCULAR: S1, S2 irregular. No murmurs, rubs, or gallops.  ABDOMEN: Soft, nontender, nondistended. Bowel sounds present. No organomegaly or mass.  EXTREMITIES: Has pedal edema, no cyanosis, or clubbing.  NEUROLOGIC: Cranial nerves II through XII are intact. Muscle strength 5/5 in all extremities. Sensation intact. Gait not checked.  PSYCHIATRIC: The patient is alert and oriented x 3.  SKIN: No obvious rash, lesion, or ulcer.   LABORATORY PANEL:   CBC  Recent Labs Lab 09/15/15 0102  WBC 10.1  HGB 11.8*  HCT 35.7*  PLT 310  MCV 88.8  MCH 29.3  MCHC 33.0  RDW 21.4*   ------------------------------------------------------------------------------------------------------------------  Chemistries   Recent Labs Lab 09/15/15 0102  NA 136  K 2.9*  CL 73*  CO2 47*  GLUCOSE 141*  BUN 70*  CREATININE 2.31*  CALCIUM 8.7*   ------------------------------------------------------------------------------------------------------------------ estimated creatinine clearance is 40.4 mL/min (by C-G formula based on SCr of 2.31 mg/dL). ------------------------------------------------------------------------------------------------------------------ No results for input(s): TSH, T4TOTAL, T3FREE, THYROIDAB in the last 72 hours.  Invalid input(s): FREET3   Coagulation profile  Recent Labs Lab 09/15/15 0102  INR 3.35   ------------------------------------------------------------------------------------------------------------------- No results for input(s): DDIMER in the last 72 hours. -------------------------------------------------------------------------------------------------------------------  Cardiac Enzymes  Recent Labs Lab 09/15/15 0102  TROPONINI 0.11*   ------------------------------------------------------------------------------------------------------------------ Invalid input(s):  POCBNP  ---------------------------------------------------------------------------------------------------------------  Urinalysis No results found for: COLORURINE, APPEARANCEUR, LABSPEC, PHURINE, GLUCOSEU, HGBUR, BILIRUBINUR, KETONESUR, PROTEINUR, UROBILINOGEN, NITRITE, LEUKOCYTESUR   RADIOLOGY: Dg Chest 2 View  Result Date: 09/15/2015 CLINICAL DATA:  Acute onset of left upper chest pain and shortness of breath. Nausea. Initial encounter. EXAM: CHEST  2 VIEW COMPARISON:  Chest radiograph performed 08/16/2015 FINDINGS: Patchy right basilar airspace opacity is compatible with pneumonia. Mild vascular congestion is noted. No pleural effusion or pneumothorax is seen. The heart is enlarged. No acute osseous abnormalities are identified. IMPRESSION: 1. Patchy right basilar airspace opacity is compatible with pneumonia. 2. Mild vascular congestion and cardiomegaly. Electronically Signed   By: Roanna Raider M.D.   On: 09/15/2015 01:54    EKG: Orders placed or performed during the hospital encounter of 09/15/15  . EKG 12-Lead  . EKG 12-Lead  . ED EKG within 10 minutes  . ED EKG within 10 minutes    IMPRESSION AND PLAN: 55 year-old male patient with history of chronic atrial fibrillation on Coumadin, congestive heart failure, COPD, hypertension presented to the emergency room with difficulty breathing and chest pain and swelling in the legs Admitting diagnosis 1. Decompensated heart failure 2. Right lung community acquired pneumonia 3. Pleuritic chest pain probably secondary to pneumonia 4. Elevated troponin secondary to renal failure 5. Acute renal insufficiency Treatment plan Admit patient to telemetry inpatient service Start patient on IV Rocephin and Zithromax antibiotics Cycle troponin to rule out ischemia Check echocardiogram Diurese patient with IV Lasix Monitor renal function Continue anticoagulation patient with oral Coumadin Supportive care.  All the records are reviewed and  case discussed with ED provider. Management plans discussed with the patient, family and they are in agreement.  CODE STATUS:FULL Code Status History  This patient does not have a recorded code status. Please follow your organizational policy for patients in this situation.       TOTAL TIME TAKING CARE OF THIS PATIENT: 55 minutes.    Ihor AustinPavan Griffen Frayne M.D on 09/15/2015 at 4:11 AM  Between 7am to 6pm - Pager - [phone]  After 6pm go to [email or web address] - password [email or web address] [facility]  [facility]  Office  [phone]  CC: Primary care physician; [facility]

## 2015-09-16 ENCOUNTER — Emergency Department: Payer: Medicaid Other

## 2015-09-16 ENCOUNTER — Inpatient Hospital Stay
Admission: EM | Admit: 2015-09-16 | Discharge: 2015-09-19 | DRG: 190 | Disposition: A | Discharge status: Home / Self Care | Payer: Medicaid Other | Attending: Internal Medicine | Admitting: Internal Medicine

## 2015-09-16 ENCOUNTER — Encounter: Payer: Self-pay | Admitting: Emergency Medicine

## 2015-09-16 DIAGNOSIS — E785 Hyperlipidemia, unspecified: Secondary | ICD-10-CM | POA: Diagnosis present

## 2015-09-16 DIAGNOSIS — G9341 Metabolic encephalopathy: Secondary | ICD-10-CM | POA: Diagnosis present

## 2015-09-16 DIAGNOSIS — F1120 Opioid dependence, uncomplicated: Secondary | ICD-10-CM | POA: Diagnosis present

## 2015-09-16 DIAGNOSIS — F1011 Alcohol abuse, in remission: Secondary | ICD-10-CM

## 2015-09-16 DIAGNOSIS — K219 Gastro-esophageal reflux disease without esophagitis: Secondary | ICD-10-CM | POA: Diagnosis present

## 2015-09-16 DIAGNOSIS — J440 Chronic obstructive pulmonary disease with acute lower respiratory infection: Secondary | ICD-10-CM | POA: Diagnosis present

## 2015-09-16 DIAGNOSIS — J189 Pneumonia, unspecified organism: Secondary | ICD-10-CM | POA: Diagnosis present

## 2015-09-16 DIAGNOSIS — Z86718 Personal history of other venous thrombosis and embolism: Secondary | ICD-10-CM

## 2015-09-16 DIAGNOSIS — F329 Major depressive disorder, single episode, unspecified: Secondary | ICD-10-CM | POA: Diagnosis present

## 2015-09-16 DIAGNOSIS — I959 Hypotension, unspecified: Secondary | ICD-10-CM | POA: Diagnosis present

## 2015-09-16 DIAGNOSIS — J9601 Acute respiratory failure with hypoxia: Secondary | ICD-10-CM | POA: Diagnosis present

## 2015-09-16 DIAGNOSIS — I4891 Unspecified atrial fibrillation: Secondary | ICD-10-CM | POA: Diagnosis present

## 2015-09-16 DIAGNOSIS — E860 Dehydration: Secondary | ICD-10-CM | POA: Diagnosis present

## 2015-09-16 DIAGNOSIS — I130 Hypertensive heart and chronic kidney disease with heart failure and stage 1 through stage 4 chronic kidney disease, or unspecified chronic kidney disease: Secondary | ICD-10-CM | POA: Diagnosis present

## 2015-09-16 DIAGNOSIS — F17210 Nicotine dependence, cigarettes, uncomplicated: Secondary | ICD-10-CM | POA: Diagnosis present

## 2015-09-16 DIAGNOSIS — F419 Anxiety disorder, unspecified: Secondary | ICD-10-CM | POA: Diagnosis present

## 2015-09-16 DIAGNOSIS — I5022 Chronic systolic (congestive) heart failure: Secondary | ICD-10-CM | POA: Diagnosis present

## 2015-09-16 DIAGNOSIS — I248 Other forms of acute ischemic heart disease: Secondary | ICD-10-CM | POA: Diagnosis present

## 2015-09-16 DIAGNOSIS — J441 Chronic obstructive pulmonary disease with (acute) exacerbation: Secondary | ICD-10-CM | POA: Diagnosis present

## 2015-09-16 DIAGNOSIS — I252 Old myocardial infarction: Secondary | ICD-10-CM | POA: Diagnosis not present

## 2015-09-16 DIAGNOSIS — Z7982 Long term (current) use of aspirin: Secondary | ICD-10-CM

## 2015-09-16 DIAGNOSIS — N179 Acute kidney failure, unspecified: Secondary | ICD-10-CM | POA: Diagnosis present

## 2015-09-16 DIAGNOSIS — F1010 Alcohol abuse, uncomplicated: Secondary | ICD-10-CM | POA: Diagnosis present

## 2015-09-16 DIAGNOSIS — R569 Unspecified convulsions: Secondary | ICD-10-CM | POA: Diagnosis present

## 2015-09-16 DIAGNOSIS — E876 Hypokalemia: Secondary | ICD-10-CM | POA: Diagnosis present

## 2015-09-16 DIAGNOSIS — N183 Chronic kidney disease, stage 3 (moderate): Secondary | ICD-10-CM | POA: Diagnosis present

## 2015-09-16 DIAGNOSIS — I509 Heart failure, unspecified: Secondary | ICD-10-CM

## 2015-09-16 DIAGNOSIS — Z79899 Other long term (current) drug therapy: Secondary | ICD-10-CM

## 2015-09-16 LAB — LIPASE, BLOOD: LIPASE: 26 U/L (ref 11–51)

## 2015-09-16 LAB — CBC WITH DIFFERENTIAL/PLATELET
BASOS ABS: 0 10*3/uL (ref 0–0.1)
Basophils Relative: 0 %
Eosinophils Absolute: 0 10*3/uL (ref 0–0.7)
Eosinophils Relative: 0 %
HEMATOCRIT: 35.7 % — AB (ref 40.0–52.0)
HEMOGLOBIN: 11.5 g/dL — AB (ref 13.0–18.0)
LYMPHS PCT: 7 %
Lymphs Abs: 0.6 10*3/uL — ABNORMAL LOW (ref 1.0–3.6)
MCH: 29.3 pg (ref 26.0–34.0)
MCHC: 32.2 g/dL (ref 32.0–36.0)
MCV: 90.9 fL (ref 80.0–100.0)
MONO ABS: 0.8 10*3/uL (ref 0.2–1.0)
Monocytes Relative: 9 %
NEUTROS ABS: 7.5 10*3/uL — AB (ref 1.4–6.5)
NEUTROS PCT: 84 %
Platelets: 360 10*3/uL (ref 150–440)
RBC: 3.93 MIL/uL — ABNORMAL LOW (ref 4.40–5.90)
RDW: 21.2 % — AB (ref 11.5–14.5)
WBC: 8.9 10*3/uL (ref 3.8–10.6)

## 2015-09-16 LAB — PROTIME-INR
INR: 2.37
Prothrombin Time: 26.3 seconds — ABNORMAL HIGH (ref 11.4–15.2)

## 2015-09-16 LAB — COMPREHENSIVE METABOLIC PANEL
ALBUMIN: 2.8 g/dL — AB (ref 3.5–5.0)
ALT: 76 U/L — AB (ref 17–63)
AST: 21 U/L (ref 15–41)
Alkaline Phosphatase: 77 U/L (ref 38–126)
Anion gap: 17 — ABNORMAL HIGH (ref 5–15)
BUN: 90 mg/dL — AB (ref 6–20)
CHLORIDE: 76 mmol/L — AB (ref 101–111)
CO2: 42 mmol/L — AB (ref 22–32)
CREATININE: 3.4 mg/dL — AB (ref 0.61–1.24)
Calcium: 8.6 mg/dL — ABNORMAL LOW (ref 8.9–10.3)
GFR calc Af Amer: 22 mL/min — ABNORMAL LOW (ref >60)
GFR calc non Af Amer: 19 mL/min — ABNORMAL LOW (ref >60)
GLUCOSE: 107 mg/dL — AB (ref 65–99)
Potassium: 3.4 mmol/L — ABNORMAL LOW (ref 3.5–5.1)
SODIUM: 135 mmol/L (ref 135–145)
Total Bilirubin: 1 mg/dL (ref 0.3–1.2)
Total Protein: 6.4 g/dL — ABNORMAL LOW (ref 6.5–8.1)

## 2015-09-16 LAB — ETHANOL: Alcohol, Ethyl (B): <5 mg/dL (ref <5)

## 2015-09-16 MED ORDER — SODIUM CHLORIDE 0.9 % IV SOLN
INTRAVENOUS | Status: DC
  Administered 2015-09-16 – 2015-09-17 (×2): via INTRAVENOUS

## 2015-09-16 MED ORDER — AZITHROMYCIN 250 MG PO TABS
250.0000 mg | ORAL_TABLET | Freq: Once | ORAL | Status: AC
  Administered 2015-09-16: 250 mg via ORAL
  Filled 2015-09-16: qty 1

## 2015-09-16 MED ORDER — ONDANSETRON HCL 4 MG/2ML IJ SOLN: 4.0000 mg | Freq: Four times a day (QID) | INTRAMUSCULAR | Status: DC | PRN

## 2015-09-16 MED ORDER — ACETAMINOPHEN 650 MG RE SUPP: 650.0000 mg | Freq: Four times a day (QID) | RECTAL | Status: DC | PRN

## 2015-09-16 MED ORDER — ASPIRIN EC 81 MG PO TBEC
81.0000 mg | DELAYED_RELEASE_TABLET | Freq: Every day | ORAL | Status: DC
  Administered 2015-09-17 – 2015-09-18 (×2): 81 mg via ORAL
  Filled 2015-09-16 (×2): qty 1

## 2015-09-16 MED ORDER — FAMOTIDINE 20 MG PO TABS
20.0000 mg | ORAL_TABLET | Freq: Two times a day (BID) | ORAL | Status: DC
  Administered 2015-09-17 – 2015-09-18 (×4): 20 mg via ORAL
  Filled 2015-09-16 (×6): qty 1

## 2015-09-16 MED ORDER — DEXTROSE 5 % IV SOLN
250.0000 mg | INTRAVENOUS | Status: DC
  Administered 2015-09-17 – 2015-09-18 (×2): 250 mg via INTRAVENOUS
  Filled 2015-09-16 (×3): qty 250

## 2015-09-16 MED ORDER — METHYLPREDNISOLONE SODIUM SUCC 40 MG IJ SOLR
40.0000 mg | Freq: Four times a day (QID) | INTRAMUSCULAR | Status: DC
  Administered 2015-09-16 – 2015-09-19 (×10): 40 mg via INTRAVENOUS
  Filled 2015-09-16 (×10): qty 1

## 2015-09-16 MED ORDER — ONDANSETRON HCL 4 MG PO TABS: 4.0000 mg | ORAL_TABLET | Freq: Four times a day (QID) | ORAL | Status: DC | PRN

## 2015-09-16 MED ORDER — DEXTROSE 5 % IV SOLN
1.0000 g | INTRAVENOUS | Status: DC
  Administered 2015-09-17 – 2015-09-18 (×2): 1 g via INTRAVENOUS
  Filled 2015-09-16 (×3): qty 10

## 2015-09-16 MED ORDER — IPRATROPIUM-ALBUTEROL 0.5-2.5 (3) MG/3ML IN SOLN
3.0000 mL | Freq: Four times a day (QID) | RESPIRATORY_TRACT | Status: DC
  Administered 2015-09-17 – 2015-09-19 (×10): 3 mL via RESPIRATORY_TRACT
  Filled 2015-09-16 (×10): qty 3

## 2015-09-16 MED ORDER — HEPARIN SODIUM (PORCINE) 5000 UNIT/ML IJ SOLN
5000.0000 [IU] | Freq: Three times a day (TID) | INTRAMUSCULAR | Status: DC
Start: 2015-09-16 — End: 2015-09-17
  Administered 2015-09-16 – 2015-09-17 (×2): 5000 [IU] via SUBCUTANEOUS
  Filled 2015-09-16 (×2): qty 1

## 2015-09-16 MED ORDER — SODIUM CHLORIDE 0.9% FLUSH
3.0000 mL | Freq: Two times a day (BID) | INTRAVENOUS | Status: DC
Start: 2015-09-16 — End: 2015-09-19
  Administered 2015-09-17 – 2015-09-18 (×4): 3 mL via INTRAVENOUS

## 2015-09-16 MED ORDER — DEXTROSE 5 % IV SOLN
1.0000 g | Freq: Once | INTRAVENOUS | Status: AC
  Administered 2015-09-16: 1 g via INTRAVENOUS
  Filled 2015-09-16: qty 10

## 2015-09-16 MED ORDER — SODIUM CHLORIDE 0.9 % IV BOLUS (SEPSIS)
2000.0000 mL | Freq: Once | INTRAVENOUS | Status: AC
  Administered 2015-09-16: 2000 mL via INTRAVENOUS

## 2015-09-16 MED ORDER — SERTRALINE HCL 50 MG PO TABS
50.0000 mg | ORAL_TABLET | Freq: Every day | ORAL | Status: DC
  Administered 2015-09-17 – 2015-09-18 (×2): 50 mg via ORAL
  Filled 2015-09-16 (×2): qty 1

## 2015-09-16 MED ORDER — ATORVASTATIN CALCIUM 20 MG PO TABS
80.0000 mg | ORAL_TABLET | Freq: Every evening | ORAL | Status: DC
  Administered 2015-09-17 – 2015-09-18 (×2): 80 mg via ORAL
  Filled 2015-09-16 (×2): qty 4

## 2015-09-16 MED ORDER — ACETAMINOPHEN 325 MG PO TABS
650.0000 mg | ORAL_TABLET | Freq: Four times a day (QID) | ORAL | Status: DC | PRN
Start: 2015-09-16 — End: 2015-09-19

## 2015-09-16 MED ORDER — BUDESONIDE 0.25 MG/2ML IN SUSP
0.2500 mg | Freq: Two times a day (BID) | RESPIRATORY_TRACT | Status: DC
  Administered 2015-09-17 – 2015-09-19 (×5): 0.25 mg via RESPIRATORY_TRACT
  Filled 2015-09-16 (×6): qty 2

## 2015-09-16 NOTE — ED Triage Notes (Addendum)
Ems from home for seizure. Per ems, Pt walking in hall grunted and fell face down. Per ems, used heroin "small amount" approx. 1330. Pt recently seen here for pneumonia.

## 2015-09-16 NOTE — ED Notes (Signed)
Admitting MD at bedside.

## 2015-09-16 NOTE — H&P (Signed)
[employer] [facility]   PATIENT NAME: Kirk DiesDavid Chokshi    MR#:  [id number]  DATE OF BIRTH:  18-Apr-1960  DATE OF ADMISSION:  09/16/2015  PRIMARY CARE PHYSICIAN: [facility]   REQUESTING/REFERRING PHYSICIAN: Dr. Alfonse FlavorsPhilip Stafford  CHIEF COMPLAINT:   Chief Complaint  Patient presents with  . Seizures    HISTORY OF PRESENT ILLNESS:  Kirk Cortez  is a 55 y.o. male with a known history of Chronic systolic CHF, COPD, polysubstance abuse, previous history of MI, hypertension, atrial fibrillation who presents to the hospital as he was found unresponsive at his home. Patient was recently hospitalized 2 days ago for chest pain and shortness of breath secondary to COPD and required pneumonia but left AGAINST MEDICAL ADVICE and now returns as he was found unresponsive at home. As per EMS he did not have a postictal state although there is some concern of underlying seizure. He is currently alert and oriented. His CT head showed no acute pathology. Patient was noted to be in acute respiratory failure with hypoxia secondary to COPD exacerbation and hospitalist services were called. Further treatment and evaluation.  PAST MEDICAL HISTORY:   Past Medical History:  Diagnosis Date  . A-fib (HCC)   . CHF (congestive heart failure) (HCC)   . COPD (chronic obstructive pulmonary disease) (HCC)   . Heart attack (HCC)   . Heart murmur   . Hypertension     PAST SURGICAL HISTORY:   Past Surgical History:  Procedure Laterality Date  . APPENDECTOMY    . HERNIA REPAIR      SOCIAL HISTORY:   Social History  Substance Use Topics  . Smoking status: Current Every Day Smoker    Packs/day: 0.50    Years: 40.00  . Smokeless tobacco: Never Used  . Alcohol use No    FAMILY HISTORY:   Family History  Problem Relation Age of Onset  . Cancer Mother   . Heart attack Father     DRUG ALLERGIES:  No Known Allergies  REVIEW OF SYSTEMS:   Review of Systems   Constitutional: Negative for fever and weight loss.  HENT: Negative for congestion, nosebleeds and tinnitus.   Eyes: Negative for blurred vision, double vision and redness.  Respiratory: Positive for cough, shortness of breath and wheezing. Negative for hemoptysis.   Cardiovascular: Negative for chest pain, orthopnea, leg swelling and PND.  Gastrointestinal: Negative for abdominal pain, diarrhea, melena, nausea and vomiting.  Genitourinary: Negative for dysuria, hematuria and urgency.  Musculoskeletal: Negative for falls and joint pain.  Neurological: Negative for dizziness, tingling, sensory change, focal weakness, seizures, weakness and headaches.  Endo/Heme/Allergies: Negative for polydipsia. Does not bruise/bleed easily.  Psychiatric/Behavioral: Positive for substance abuse. Negative for depression and memory loss. The patient is not nervous/anxious.     MEDICATIONS AT HOME:   Prior to Admission medications   Medication Sig Start Date End Date Taking? Authorizing Provider  albuterol (ACCUNEB) 1.25 MG/3ML nebulizer solution Take 1.25 mg by nebulization every 6 (six) hours as needed. 08/06/15 08/05/16 Yes Historical Provider, MD  aspirin EC 81 MG tablet Take 81 mg by mouth daily. 04/11/13  Yes Historical Provider, MD  atorvastatin (LIPITOR) 80 MG tablet Take 80 mg by mouth every evening. 08/06/15  Yes Historical Provider, MD  famotidine (PEPCID) 20 MG tablet Take 20 mg by mouth 2 (two) times daily. 08/06/15  Yes Historical Provider, MD  furosemide (LASIX) 80 MG tablet Take 80 mg by mouth 2 (two) times daily. 08/06/15  Yes Historical Provider, MD  losartan (COZAAR) 25 MG tablet Take 25 mg by mouth daily. 08/06/15 08/05/16 Yes Historical Provider, MD  magnesium oxide (MAG-OX) 400 MG tablet Take 400 mg by mouth 2 (two) times daily. 07/02/15 07/01/16 Yes Historical Provider, MD  metoprolol succinate (TOPROL-XL) 100 MG 24 hr tablet Take 100 mg by mouth daily. 08/06/15  Yes Historical Provider, MD   sertraline (ZOLOFT) 50 MG tablet Take 50 mg by mouth daily. 08/06/15 08/05/16 Yes Historical Provider, MD  spironolactone (ALDACTONE) 25 MG tablet Take 25 mg by mouth daily. 08/06/15  Yes Historical Provider, MD  nitroGLYCERIN (NITROSTAT) 0.4 MG SL tablet Place 0.4 mg under the tongue every 5 (five) minutes as needed.    Historical Provider, MD      VITAL SIGNS:  Blood pressure 93/70, pulse 69, temperature 98.4 F (36.9 C), temperature source Oral, resp. rate (!) 8, height  (1.778 m), weight 86.2 kg (190 lb), SpO2 99 %.  PHYSICAL EXAMINATION:  Physical Exam  GENERAL:  55 y.o.-year-old patient lying in the bed lethargic but arousable.   EYES: Pupils equal, round, reactive to light and accommodation. No scleral icterus. Extraocular muscles intact.  HEENT: Head atraumatic, normocephalic. Oropharynx and nasopharynx clear. No oropharyngeal erythema, moist oral mucosa  NECK:  Supple, no jugular venous distention. No thyroid enlargement, no tenderness.  LUNGS: Diffuse rhonchi, wheezing b/l.  No use of accessory of muscles.  Prolonged insp. & exp. Phase.  CARDIOVASCULAR: S1, S2 RRR. No murmurs, rubs, gallops, clicks.  ABDOMEN: Soft, nontender, nondistended. Bowel sounds present. No organomegaly or mass.  EXTREMITIES: No pedal edema, cyanosis, or clubbing. + 2 pedal & radial pulses b/l.   NEUROLOGIC: Cranial nerves II through XII are intact. No focal Motor or sensory deficits appreciated b/l.  Globally weak.  PSYCHIATRIC: The patient is alert and oriented x 3.  SKIN: No obvious rash, lesion, or ulcer.   LABORATORY PANEL:   CBC  Recent Labs Lab 09/16/15 1643  WBC 8.9  HGB 11.5*  HCT 35.7*  PLT 360   ------------------------------------------------------------------------------------------------------------------  Chemistries   Recent Labs Lab 09/16/15 1643  NA 135  K 3.4*  CL 76*  CO2 42*  GLUCOSE 107*  BUN 90*  CREATININE 3.40*  CALCIUM 8.6*  AST 21  ALT 76*  ALKPHOS  77  BILITOT 1.0   ------------------------------------------------------------------------------------------------------------------  Cardiac Enzymes  Recent Labs Lab 09/15/15 0607  TROPONINI 0.09*   ------------------------------------------------------------------------------------------------------------------  RADIOLOGY:  Dg Chest 2 View  Result Date: 09/16/2015 CLINICAL DATA:  seizure. Per ems, Pt walking in hall grunted and fell face down. Per ems, used heroin "small amount" approx. 1330. Pt recently seen here for pneumonia. Smoker, COPD, Afib, HTN, hx heart attack EXAM: CHEST TWO-VIEW COMPARISON:  09/15/2015 FINDINGS: Stable moderate cardiomegaly. Some improvement in the patchy right lower lung airspace infiltrate. Left lung remains clear. No pneumothorax. No effusion. Visualized bones unremarkable. IMPRESSION: 1. Improving right lower lobe airspace disease. 2. Stable cardiomegaly Electronically Signed   By: Corlis Leak M.D.   On: 09/16/2015 17:37   Dg Chest 2 View  Result Date: 09/15/2015 CLINICAL DATA:  Acute onset of left upper chest pain and shortness of breath. Nausea. Initial encounter. EXAM: CHEST  2 VIEW COMPARISON:  Chest radiograph performed 08/16/2015 FINDINGS: Patchy right basilar airspace opacity is compatible with pneumonia. Mild vascular congestion is noted. No pleural effusion or pneumothorax is seen. The heart is enlarged. No acute osseous abnormalities are identified. IMPRESSION: 1. Patchy right basilar airspace opacity is compatible with pneumonia.  2. Mild vascular congestion and cardiomegaly. Electronically Signed   By: Roanna Raider M.D.   On: 09/15/2015 01:54   Ct Head Wo Contrast  Result Date: 09/16/2015 CLINICAL DATA:  55 year old hypertensive male with seizure. Initial encounter. EXAM: CT HEAD WITHOUT CONTRAST TECHNIQUE: Contiguous axial images were obtained from the base of the skull through the vertex without intravenous contrast. COMPARISON:  08/16/2015.  FINDINGS: Exam is motion degraded. No skull fracture or intracranial hemorrhage. No CT evidence of large acute infarct. No intracranial mass lesion noted on this unenhanced exam. No hydrocephalus. Vascular calcifications. No acute orbital abnormality. Visualized mastoid air cells, middle ear cavities and paranasal sinuses are clear. IMPRESSION: Exam is motion degraded. No skull fracture or intracranial hemorrhage. No CT evidence of large acute infarct. No intracranial mass lesion noted on this unenhanced exam. Electronically Signed   By: Lacy Duverney M.D.   On: 09/16/2015 17:32     IMPRESSION AND PLAN:   55 year old male with past medical history of atrial fibrillation, chronic systolic CHF, hypertension, substance abuse, COPD who presents to the hospital due to shortness of breath and also being found unresponsive at home.  1. Altered mental status/unresponsiveness-patient's CT head shows no evidence of acute pathology. -Etiology unclear but likely metabolic encephalopathy secondary to sepsis abuse. Patient does say he did heroin this afternoon. I will get a urine drug screen. -No evidence of hypercarbia presently.  2. COPD exacerbation-secondary to pneumonia and ongoing tobacco abuse. -Treat patient with IV steroids, scheduled DuoNeb nebs, Pulmicort nebs. -Continue empiric Antibiotics with ceftriaxone, Zithromax for pneumonia.  3. Pneumonia-likely community-acquired. -Continue ceftriaxone and Zithromax. Follow blood, sputum cultures.  4. Acute kidney injury-secondary to hypotension and dehydration. -Continue to hydrate with IV fluids, follow BUN and creatinine and urine output. Hold diuretics for now.  5. History of chronic systolic CHF-clinically patient is not in congestive heart failure -Hold diuretics, hold antihypertensives given his relative hypotension. Follow clinically.  6. Elevated troponin-likely demand ischemia from hypoxic respiratory failure and pneumonia and COPD. -Observe  on telemetry, cycle cardiac markers.  7. History of atrial fibrillation-rate controlled. Hold Toprol given his relative hypotension.  8. Hyperlipidemia-continue atorvastatin.  9. GERD-continue Pepcid.   All the records are reviewed and case discussed with ED provider. Management plans discussed with the patient, family and they are in agreement.  CODE STATUS: Full Code  TOTAL TIME TAKING CARE OF THIS PATIENT: 45 minutes.    Houston Siren M.D on 09/16/2015 at 8:03 PM  Between 7am to 6pm - Pager - [phone]  After 6pm go to [email or web address] - password [email or web address] [facility]  [facility]  Office  [phone]  CC: Primary care physician; [facility]

## 2015-09-16 NOTE — ED Provider Notes (Signed)
[facility] Emergency Department Provider Note  ____________________________________________  Time seen: Approximately 5:00 PM  I have reviewed the triage vital signs and the nursing notes.   HISTORY  Chief Complaint Seizures    HPI Kirk Cortez is a 55 y.o. male who does not have a history of seizures but was brought to the ED due to concern of seizure. He was found on the floor at home unresponsive. EMS do not report any postictal phase. Patient reports that he feels short of breath. He is in the hospital yesterday being treated for COPD exacerbation and community-acquired pneumonia but left the hospital AGAINST MEDICAL ADVICE at that time. He reports that he is feeling worse, not eating or drinking well, willing to stay in the hospital this time. He also notes that his peripheral edema seems to be worse and requests Lasix to help get the fluid off. He reports compliance with his medications     Past Medical History:  Diagnosis Date  . A-fib (HCC)   . CHF (congestive heart failure) (HCC)   . COPD (chronic obstructive pulmonary disease) (HCC)   . Heart attack (HCC)   . Heart murmur   . Hypertension      Patient Active Problem List   Diagnosis Date Noted  . Community acquired pneumonia 09/15/2015  . CAP (community acquired pneumonia) 09/15/2015  . CHF (congestive heart failure) (HCC) 08/16/2015     Past Surgical History:  Procedure Laterality Date  . APPENDECTOMY    . HERNIA REPAIR       Prior to Admission medications   Medication Sig Start Date End Date Taking? Authorizing Provider  albuterol (ACCUNEB) 1.25 MG/3ML nebulizer solution Take 1.25 mg by nebulization every 6 (six) hours as needed. 08/06/15 08/05/16 Yes Historical Provider, MD  aspirin EC 81 MG tablet Take 81 mg by mouth daily. 04/11/13  Yes Historical Provider, MD  atorvastatin (LIPITOR) 80 MG tablet Take 80 mg by mouth every evening. 08/06/15  Yes Historical Provider, MD  famotidine  (PEPCID) 20 MG tablet Take 20 mg by mouth 2 (two) times daily. 08/06/15  Yes Historical Provider, MD  furosemide (LASIX) 80 MG tablet Take 80 mg by mouth 2 (two) times daily. 08/06/15  Yes Historical Provider, MD  losartan (COZAAR) 25 MG tablet Take 25 mg by mouth daily. 08/06/15 08/05/16 Yes Historical Provider, MD  magnesium oxide (MAG-OX) 400 MG tablet Take 400 mg by mouth 2 (two) times daily. 07/02/15 07/01/16 Yes Historical Provider, MD  metoprolol succinate (TOPROL-XL) 100 MG 24 hr tablet Take 100 mg by mouth daily. 08/06/15  Yes Historical Provider, MD  sertraline (ZOLOFT) 50 MG tablet Take 50 mg by mouth daily. 08/06/15 08/05/16 Yes Historical Provider, MD  spironolactone (ALDACTONE) 25 MG tablet Take 25 mg by mouth daily. 08/06/15  Yes Historical Provider, MD  nitroGLYCERIN (NITROSTAT) 0.4 MG SL tablet Place 0.4 mg under the tongue every 5 (five) minutes as needed.    Historical Provider, MD     Allergies Review of patient's allergies indicates no known allergies.   Family History  Problem Relation Age of Onset  . Cancer Mother   . Heart attack Father     Social History Social History  Substance Use Topics  . Smoking status: Current Every Day Smoker  . Smokeless tobacco: Never Used  . Alcohol use No    Review of Systems  Constitutional:   No fever or chills.  ENT:   No sore throat. No rhinorrhea. Cardiovascular:   No chest pain.  Respiratory:   Positive shortness of breath and cough. Gastrointestinal:   Negative for abdominal pain, vomiting and diarrhea.  Genitourinary:   Negative for dysuria or difficulty urinating. Musculoskeletal:   Negative for focal pain positive bilateral lower extremity edema. Neurological:   Negative for headaches 10-point ROS otherwise negative.  ____________________________________________   PHYSICAL EXAM:  VITAL SIGNS: ED Triage Vitals  Enc Vitals Group     BP 09/16/15 1654 (!) 89/60     Pulse Rate 09/16/15 1654 95     Resp 09/16/15 1654 16      Temp 09/16/15 1654 98.4 F (36.9 C)     Temp Source 09/16/15 1654 Oral     SpO2 09/16/15 1654 99 %     Weight 09/16/15 1654 190 lb (86.2 kg)     Height 09/16/15 1654 5\' 10"  (1.778 m)     Head Circumference --      Peak Flow --      Pain Score 09/16/15 1703 4     Pain Loc --      Pain Edu? --      Excl. in GC? --     Vital signs reviewed, nursing assessments reviewed.   Constitutional:   Alert and oriented. Ill-appearing. Eyes:   No scleral icterus. No conjunctival pallor. PERRL. EOMI.  No nystagmus. ENT   Head:   Normocephalic and atraumatic.   Nose:   No congestion/rhinnorhea. No septal hematoma   Mouth/Throat:   Dry mucous membranes, no pharyngeal erythema. No peritonsillar mass.    Neck:   No stridor. No SubQ emphysema. No meningismus. No JVD Hematological/Lymphatic/Immunilogical:   No cervical lymphadenopathy. Cardiovascular:   Irregularly irregular rhythm, rate controlled at 90.. Symmetric bilateral radial and DP pulses.  No murmurs.  Respiratory:   Normal respiratory effort without tachypnea nor retractions. Scant expiratory wheezes diffusely. Gastrointestinal:   Soft and nontender. Non distended. There is no CVA tenderness.  No rebound, rigidity, or guarding. Genitourinary:   deferred Musculoskeletal:   Nontender with normal range of motion in all extremities. No joint effusions.  No lower extremity tenderness.  2+ symmetric peripheral edema bilaterally. Neurologic:   Normal speech and language.  CN 2-10 normal. Motor grossly intact. No gross focal neurologic deficits are appreciated.  Skin:    Skin is warm, dry and intact. No rash noted.  No petechiae, purpura, or bullae.  ____________________________________________    LABS (pertinent positives/negatives) (all labs ordered are listed, but only abnormal results are displayed) Labs Reviewed  COMPREHENSIVE METABOLIC PANEL - Abnormal; Notable for the following:       Result Value   Potassium 3.4 (*)     Chloride 76 (*)    CO2 42 (*)    Glucose, Bld 107 (*)    BUN 90 (*)    Creatinine, Ser 3.40 (*)    Calcium 8.6 (*)    Total Protein 6.4 (*)    Albumin 2.8 (*)    ALT 76 (*)    GFR calc non Af Amer 19 (*)    GFR calc Af Amer 22 (*)    Anion gap 17 (*)    All other components within normal limits  CBC WITH DIFFERENTIAL/PLATELET - Abnormal; Notable for the following:    RBC 3.93 (*)    Hemoglobin 11.5 (*)    HCT 35.7 (*)    RDW 21.2 (*)    Neutro Abs 7.5 (*)    Lymphs Abs 0.6 (*)    All other components within normal limits  PROTIME-INR - Abnormal; Notable for the following:    Prothrombin Time 26.3 (*)    All other components within normal limits  ETHANOL  LIPASE, BLOOD   ____________________________________________   EKG  Interpreted by me Atrial fibrillation rate of 95, normal axis and intervals. Poor R progression interpreter leads. Normal ST segments. No acute ischemic changes  ____________________________________________    RADIOLOGY  Chest x-ray shows decreased size of infiltrate from before, no evidence of pulmonary edema.  ____________________________________________   PROCEDURES Procedures CRITICAL CARE Performed by: Scotty Court, Gailene Youkhana   Total critical care time: 35 minutes  Critical care time was exclusive of separately billable procedures and treating other patients.  Critical care was necessary to treat or prevent imminent or life-threatening deterioration.  Critical care was time spent personally by me on the following activities: development of treatment plan with patient and/or surrogate as well as nursing, discussions with consultants, evaluation of patient's response to treatment, examination of patient, obtaining history from patient or surrogate, ordering and performing treatments and interventions, ordering and review of laboratory studies, ordering and review of radiographic studies, pulse oximetry and re-evaluation of patient's  condition.  ____________________________________________   INITIAL IMPRESSION / ASSESSMENT AND PLAN / ED COURSE  Pertinent labs & imaging results that were available during my care of the patient were reviewed by me and considered in my medical decision making (see chart for details).  Patient presents with borderline low blood pressure. Map is 65-75, but systolic pressures in the 80s on multiple measurements. He does appear to be dehydrated clinically. He also appears to have persistent pulmonary impairment from his pneumonia with room air hypoxia to 84%, requiring nasal cannula oxygen. Patient was given a 1 L fluid challenge without any worsening of his pulmonary status. Blood pressure did improve with this. Eventually lab results were obtained which showed that he is creatinine is 3.5 with metabolic acidosis and elevated anion gap.. It was normal in July 2017, yesterday was a little over 2, now acutely worse. Given all these findings, although it was not initially clinically apparent, it appears the patient has sepsis from his pneumonia. I'll give an additional 2 L IV fluid bolus. Patient was given ceftriaxone and azithromycin after his initial assessment. I discussed the case with the hospitalist for readmission.     Clinical Course   ____________________________________________   FINAL CLINICAL IMPRESSION(S) / ED DIAGNOSES  Final diagnoses:  Acute renal failure, unspecified acute renal failure type (HCC)  Acute respiratory failure with hypoxia (HCC)  CAP (community acquired pneumonia)  Sepsis     Portions of this note were generated with dragon dictation software. Dictation errors may occur despite best attempts at proofreading.    Sharman Cheek, MD 09/16/15 [phone]

## 2015-09-17 DIAGNOSIS — F1120 Opioid dependence, uncomplicated: Secondary | ICD-10-CM

## 2015-09-17 DIAGNOSIS — F1011 Alcohol abuse, in remission: Secondary | ICD-10-CM

## 2015-09-17 LAB — BASIC METABOLIC PANEL
Anion gap: 14 (ref 5–15)
BUN: 103 mg/dL — AB (ref 6–20)
CALCIUM: 8.5 mg/dL — AB (ref 8.9–10.3)
CO2: 41 mmol/L — ABNORMAL HIGH (ref 22–32)
Chloride: 80 mmol/L — ABNORMAL LOW (ref 101–111)
Creatinine, Ser: 3.21 mg/dL — ABNORMAL HIGH (ref 0.61–1.24)
GFR calc Af Amer: 23 mL/min — ABNORMAL LOW (ref >60)
GFR, EST NON AFRICAN AMERICAN: 20 mL/min — AB (ref >60)
Glucose, Bld: 101 mg/dL — ABNORMAL HIGH (ref 65–99)
POTASSIUM: 3.2 mmol/L — AB (ref 3.5–5.1)
SODIUM: 135 mmol/L (ref 135–145)

## 2015-09-17 LAB — TROPONIN I
TROPONIN I: 0.14 ng/mL — AB (ref <0.03)
TROPONIN I: 0.13 ng/mL — AB (ref <0.03)
TROPONIN I: 0.13 ng/mL — AB (ref <0.03)

## 2015-09-17 LAB — CBC
HEMATOCRIT: 34.1 % — AB (ref 40.0–52.0)
Hemoglobin: 11.2 g/dL — ABNORMAL LOW (ref 13.0–18.0)
MCH: 29.3 pg (ref 26.0–34.0)
MCHC: 32.9 g/dL (ref 32.0–36.0)
MCV: 89.1 fL (ref 80.0–100.0)
PLATELETS: 327 10*3/uL (ref 150–440)
RBC: 3.83 MIL/uL — ABNORMAL LOW (ref 4.40–5.90)
RDW: 20.5 % — AB (ref 11.5–14.5)
WBC: 7.4 10*3/uL (ref 3.8–10.6)

## 2015-09-17 MED ORDER — RIVAROXABAN 20 MG PO TABS
20.0000 mg | ORAL_TABLET | Freq: Every day | ORAL | Status: DC
  Administered 2015-09-17 – 2015-09-18 (×2): 20 mg via ORAL
  Filled 2015-09-17 (×2): qty 1

## 2015-09-17 MED ORDER — IPRATROPIUM-ALBUTEROL 0.5-2.5 (3) MG/3ML IN SOLN: 3.0000 mL | RESPIRATORY_TRACT | Status: DC | PRN

## 2015-09-17 MED ORDER — NICOTINE 14 MG/24HR TD PT24
14.0000 mg | MEDICATED_PATCH | Freq: Every day | TRANSDERMAL | Status: DC
  Administered 2015-09-17 – 2015-09-18 (×2): 14 mg via TRANSDERMAL
  Filled 2015-09-17 (×2): qty 1

## 2015-09-17 MED ORDER — METOPROLOL SUCCINATE ER 100 MG PO TB24
100.0000 mg | ORAL_TABLET | Freq: Every day | ORAL | Status: DC
  Administered 2015-09-17 – 2015-09-18 (×2): 100 mg via ORAL
  Filled 2015-09-17 (×2): qty 1

## 2015-09-17 MED ORDER — BUDESONIDE 0.25 MG/2ML IN SUSP
0.2500 mg | RESPIRATORY_TRACT | Status: DC | PRN
  Administered 2015-09-17: 0.25 mg via RESPIRATORY_TRACT

## 2015-09-17 MED ORDER — FUROSEMIDE 20 MG PO TABS
20.0000 mg | ORAL_TABLET | Freq: Two times a day (BID) | ORAL | Status: DC
  Administered 2015-09-17 – 2015-09-18 (×3): 20 mg via ORAL
  Filled 2015-09-17 (×4): qty 1

## 2015-09-17 MED ORDER — POTASSIUM CHLORIDE CRYS ER 20 MEQ PO TBCR
20.0000 meq | EXTENDED_RELEASE_TABLET | Freq: Once | ORAL | Status: AC
  Administered 2015-09-17: 20 meq via ORAL
  Filled 2015-09-17: qty 1

## 2015-09-17 MED ORDER — IPRATROPIUM BROMIDE 0.02 % IN SOLN
RESPIRATORY_TRACT | Status: AC
  Filled 2015-09-17: qty 2.5

## 2015-09-17 NOTE — Clinical Social Work Note (Signed)
Clinical Social Work Assessment  Patient Details  Name: Kirk Cortez MRN: [id number] Date of Birth: 05-01-1960  Date of referral:  09/17/15               Reason for consult:  Substance Use/ETOH Abuse                Permission sought to share information with:    Permission granted to share information::     Name::        Agency::     Relationship::     Contact Information:     Housing/Transportation Living arrangements for the past 2 months:  Apartment Source of Information:  Patient Patient Interpreter Needed:  None Criminal Activity/Legal Involvement Pertinent to Current Situation/Hospitalization:  No - Comment as needed Significant Relationships:  Siblings Lives with:  Siblings, Friends Do you feel safe going back to the place where you live?  Yes Need for family participation in patient care:  No (Coment)  Care giving concerns:  Patient has been independent in adl's and able to care for himself.   Social Worker assessment / plan:  CSW met with patient this afternoon regarding his substance abuse. Patient was receptive to [place] visit and was appropriate. Patient was a little restless and expressed fearfulness regarding what he has been doing to his body with the heroine. Patient stated that he wants to get help because he knows that the medical condition of his body will not be able to withstand continued chronic use of heroine. Patient stated that he lives on and off with his sister and that at other times he stays with friends. Patient states that he will have a home to go to when he leaves the hospital. CSW discussed outpatient resources for drug rehab and patient stated that he was interested in this. Resources for [employer] were left with patient. Patient reports that he has a disability check that he gets every month as well.  Employment status:  Disabled (Comment on whether or not currently receiving Disability) Insurance information:  Medicaid [place]  Recommendations:  Not assessed at this time Information / Referral to community resources:     Patient/Family's Response to care:  Patient expressed appreciation for CSW assistance.   Patient/Family's Understanding of and Emotional Response to Diagnosis, Current Treatment, and Prognosis:  Patient states he is ready to consider rehab options but it is uncertain how committed he is to the decision.   Emotional Assessment Appearance:  Appears older than stated age Attitude/Demeanor/Rapport:    Affect (typically observed):  Calm, Restless Orientation:  Oriented to Self, Oriented to Place, Oriented to  Time, Oriented to Situation Alcohol / Substance use:  Illicit Drugs Psych involvement (Current and /or in the community):  Yes (Comment) (Psychiatry Consult Pending)  Discharge Needs  Concerns to be addressed:  Substance Abuse Concerns Readmission within the last 30 days:  Yes Current discharge risk:  None Barriers to Discharge:  No Barriers Identified   Shela Leff, LCSW 09/17/2015, 3:05 PM

## 2015-09-17 NOTE — Progress Notes (Signed)
Pt's blood pressure is 153/121. Dr. Luberta MutterKonidena notified and she ordered metoprolol. Otilio JeffersonMadelyn S Fenton, RN

## 2015-09-17 NOTE — Progress Notes (Signed)
Dr. Luberta MutterKonidena on floor now. Aware of low potassium - MD to place orders. Also aware that patient is on continuous IV fluids despite history of CHF, aware of kidney concerns as noted in previous MD notes - Dr. Luberta MutterKonidena to assess and place orders as needed. Will continue to monitor.

## 2015-09-17 NOTE — Consult Note (Signed)
[facility] Consult   Reason for Consult:  Consult for 55 year old man with a history of opiate dependence currently in the hospital with a pneumonia Referring Physician:  Vianne Bulls Patient Identification: Kirk Cortez MRN:  [id number] Principal Diagnosis: <principal problem not specified> Diagnosis:   Patient Active Problem List   Diagnosis Date Noted  . Opiate dependence ([place]) [F11.20] 09/17/2015  . 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 Acute respiratory failure with hypoxia ([employer]) [J96.01] 09/16/2015  . Community acquired pneumonia [J18.9] 09/15/2015  . CAP (community acquired pneumonia) [J18.9] 09/15/2015  . CHF (congestive heart failure) ([place]) [I50.9] 08/16/2015    Total Time spent with patient: 1 hour  Subjective:   Kirk Cortez is a 55 y.o. male patient admitted with "I was just not able to get any better".  HPI:  Patient interviewed. Chart reviewed. Labs reviewed. This is a 55 year old man who walked out of the hospital AMA recently but then came back with even worse pneumonia. As far as his psychiatric history he reports that he has been abusing narcotics that he continuously for the last 10 years. Usually he uses oral pain medicine but at times he has used heroin as well. He denies that he uses intravenously and says that he always snorts or takes pills. He has a past history of drinking as well but denies that he is recently been drinking. He says that he had been using Suboxone which he was buying off the street for the last few weeks but just before coming in he did snort a small amount of heroin. Mood is stated as being stable. Not complaining of depression. Some sleep problems but not a major complaint. Denies any hallucinations or psychotic symptoms. Today the patient denies having any physical pain. He denies any GI distress specifically denying any homicidal or diarrhea. He does say that he would like to stop using narcotics.  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 He plans to stay with his sister again when he gets out of the hospital. 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  Medical history: Congestive heart failure. COPD. Atrial fibrillation. Gets his regular medical care through the [facility] clinic.  Substance abuse history: Patient describes a long history of alcohol abuse going back decades. Says that he had cocaine problems decades ago. He describes a history of having been actively involved in [employer] years ago. For the last few years he says he's been mainly using narcotics. Has not been involved in any active substance abuse treatment recently. Doesn't sound like he's ever been in any kind of real rehabilitation.  Past Psychiatric History: Denies any history of major depression denies any history of other psychiatric treatment besides substance abuse. No history of suicide attempts.  Risk to Self: Is patient at risk for suicide?: No Risk to Others:   Prior Inpatient Therapy:   Prior Outpatient Therapy:    Past Medical History:  Past Medical History:  Diagnosis Date  . A-fib ([place])   . CHF (congestive heart failure) ([place])   . COPD (chronic obstructive pulmonary disease) ([place])   . Heart attack ([place])   . Heart murmur   . Hypertension     Past Surgical History:  Procedure Laterality Date  . APPENDECTOMY    . HERNIA REPAIR     Family History:  Family History  Problem Relation Age of Onset  .  Cancer Mother   . Heart attack Father    Family Psychiatric  History: He reports having several nephews that he says have substance abuse problems and possibly psychiatric problems Social History:  History  Alcohol Use No     History  Drug Use  . Types: Heroin    Social History   Social History  . Marital status: Single    Spouse name: N/A  . Number of children: N/A  . Years of education: N/A    Occupational History  . disabled    Social History Main Topics  . Smoking status: Current Every Day Smoker    Packs/day: 0.50    Years: 40.00  . Smokeless tobacco: Never Used  . Alcohol use No  . Drug use:     Types: Heroin  . Sexual activity: Not Asked   Other Topics Concern  . None   Social History Narrative  . None   Additional Social History:    Allergies:  No Known Allergies  Labs:  Results for orders placed or performed during the hospital encounter of 09/16/15 (from the past 48 hour(s))  Comprehensive metabolic panel     Status: Abnormal   Collection Time: 09/16/15  4:43 PM  Result Value Ref Range   Sodium 135 135 - 145 mmol/L   Potassium 3.4 (L) 3.5 - 5.1 mmol/L    Comment: HEMOLYSIS AT THIS LEVEL MAY AFFECT RESULT   Chloride 76 (L) 101 - 111 mmol/L   CO2 42 (H) 22 - 32 mmol/L   Glucose, Bld 107 (H) 65 - 99 mg/dL   BUN 90 (H) 6 - 20 mg/dL    Comment: RESULT CONFIRMED BY MANUAL DILUTION   Creatinine, Ser 3.40 (H) 0.61 - 1.24 mg/dL   Calcium 8.6 (L) 8.9 - 10.3 mg/dL   Total Protein 6.4 (L) 6.5 - 8.1 g/dL   Albumin 2.8 (L) 3.5 - 5.0 g/dL   AST 21 15 - 41 U/L   ALT 76 (H) 17 - 63 U/L   Alkaline Phosphatase 77 38 - 126 U/L   Total Bilirubin 1.0 0.3 - 1.2 mg/dL   GFR calc non Af Amer 19 (L) >60 mL/min   GFR calc Af Amer 22 (L) >60 mL/min    Comment: (NOTE) The eGFR has been calculated using the CKD EPI equation. This calculation has not been validated in all clinical situations. eGFR's persistently <60 mL/min signify possible Chronic Kidney Disease.    Anion gap 17 (H) 5 - 15  Ethanol     Status: None   Collection Time: 09/16/15  4:43 PM  Result Value Ref Range   Alcohol, Ethyl (B) <5 <5 mg/dL    Comment:        LOWEST DETECTABLE LIMIT FOR SERUM ALCOHOL IS 5 mg/dL FOR MEDICAL PURPOSES ONLY   Lipase, blood     Status: None   Collection Time: 09/16/15  4:43 PM  Result Value Ref Range   Lipase 26 11 - 51 U/L  CBC with Differential     Status:  Abnormal   Collection Time: 09/16/15  4:43 PM  Result Value Ref Range   WBC 8.9 3.8 - 10.6 K/uL   RBC 3.93 (L) 4.40 - 5.90 MIL/uL   Hemoglobin 11.5 (L) 13.0 - 18.0 g/dL   HCT 35.7 (L) 40.0 - 52.0 %   MCV 90.9 80.0 - 100.0 fL   MCH 29.3 26.0 - 34.0 pg   MCHC 32.2 32.0 - 36.0 g/dL   RDW 21.2 (H) 11.5 -  14.5 %   Platelets 360 150 - 440 K/uL   Neutrophils Relative % 84 %   Neutro Abs 7.5 (H) 1.4 - 6.5 K/uL   Lymphocytes Relative 7 %   Lymphs Abs 0.6 (L) 1.0 - 3.6 K/uL   Monocytes Relative 9 %   Monocytes Absolute 0.8 0.2 - 1.0 K/uL   Eosinophils Relative 0 %   Eosinophils Absolute 0.0 0 - 0.7 K/uL   Basophils Relative 0 %   Basophils Absolute 0.0 0 - 0.1 K/uL  Protime-INR     Status: Abnormal   Collection Time: 09/16/15  4:43 PM  Result Value Ref Range   Prothrombin Time 26.3 (H) 11.4 - 15.2 seconds   INR 2.37   Troponin I     Status: Abnormal   Collection Time: 09/16/15  9:45 PM  Result Value Ref Range   Troponin I 0.14 (HH) <0.03 ng/mL    Comment: CRITICAL VALUE NOTED. VALUE IS CONSISTENT WITH PREVIOUSLY REPORTED/CALLED VALUE TLB   Basic metabolic panel     Status: Abnormal   Collection Time: 09/17/15  1:11 AM  Result Value Ref Range   Sodium 135 135 - 145 mmol/L   Potassium 3.2 (L) 3.5 - 5.1 mmol/L   Chloride 80 (L) 101 - 111 mmol/L   CO2 41 (H) 22 - 32 mmol/L   Glucose, Bld 101 (H) 65 - 99 mg/dL   BUN 103 (H) 6 - 20 mg/dL    Comment: RESULT CONFIRMED BY MANUAL DILUTION   Creatinine, Ser 3.21 (H) 0.61 - 1.24 mg/dL   Calcium 8.5 (L) 8.9 - 10.3 mg/dL   GFR calc non Af Amer 20 (L) >60 mL/min   GFR calc Af Amer 23 (L) >60 mL/min    Comment: (NOTE) The eGFR has been calculated using the CKD EPI equation. This calculation has not been validated in all clinical situations. eGFR's persistently <60 mL/min signify possible Chronic Kidney Disease.    Anion gap 14 5 - 15  CBC     Status: Abnormal   Collection Time: 09/17/15  1:11 AM  Result Value Ref Range   WBC 7.4 3.8 -  10.6 K/uL   RBC 3.83 (L) 4.40 - 5.90 MIL/uL   Hemoglobin 11.2 (L) 13.0 - 18.0 g/dL   HCT 34.1 (L) 40.0 - 52.0 %   MCV 89.1 80.0 - 100.0 fL   MCH 29.3 26.0 - 34.0 pg   MCHC 32.9 32.0 - 36.0 g/dL   RDW 20.5 (H) 11.5 - 14.5 %   Platelets 327 150 - 440 K/uL  Troponin I     Status: Abnormal   Collection Time: 09/17/15  1:11 AM  Result Value Ref Range   Troponin I 0.13 (HH) <0.03 ng/mL    Comment: CRITICAL VALUE NOTED.  VALUE IS CONSISTENT WITH PREVIOUSLY REPORTED AND CALLED VALUE.  Troponin I     Status: Abnormal   Collection Time: 09/17/15  4:52 AM  Result Value Ref Range   Troponin I 0.13 (HH) <0.03 ng/mL    Comment: CRITICAL RESULT CALLED TO, READ BACK BY AND VERIFIED WITH: MATTIE SENTON AT 5462 09/17/2015 BY TFK CORRECTED ON 08/10 AT 1555: PREVIOUSLY REPORTED AS 0.13 CRITICAL VALUE NOTED. VALUE IS CONSISTENT WITH PREVIOUSLY REPORTED/CALLED VALUE  SDR 09/17/15     Current Facility-Administered Medications  Medication Dose Route Frequency Provider Last Rate Last Dose  . acetaminophen (TYLENOL) tablet 650 mg  650 mg Oral Q6H PRN Henreitta Leber, MD       Or  .  acetaminophen (TYLENOL) suppository 650 mg  650 mg Rectal Q6H PRN Houston Siren, MD      . aspirin EC tablet 81 mg  81 mg Oral Daily Houston Siren, MD   81 mg at 09/17/15 0939  . atorvastatin (LIPITOR) tablet 80 mg  80 mg Oral QPM Houston Siren, MD      . azithromycin (ZITHROMAX) 250 mg in dextrose 5 % 125 mL IVPB  250 mg Intravenous Q24H Houston Siren, MD      . budesonide (PULMICORT) nebulizer solution 0.25 mg  0.25 mg Nebulization BID Houston Siren, MD   0.25 mg at 09/17/15 0739  . cefTRIAXone (ROCEPHIN) 1 g in dextrose 5 % 50 mL IVPB  1 g Intravenous Q24H Houston Siren, MD      . famotidine (PEPCID) tablet 20 mg  20 mg Oral BID Houston Siren, MD   20 mg at 09/17/15 0939  . furosemide (LASIX) tablet 20 mg  20 mg Oral BID Katha Hamming, MD      . ipratropium-albuterol (DUONEB) 0.5-2.5 (3) MG/3ML nebulizer  solution 3 mL  3 mL Nebulization Q6H Houston Siren, MD   3 mL at 09/17/15 1409  . methylPREDNISolone sodium succinate (SOLU-MEDROL) 40 mg/mL injection 40 mg  40 mg Intravenous Q6H Houston Siren, MD   40 mg at 09/17/15 1542  . metoprolol succinate (TOPROL-XL) 24 hr tablet 100 mg  100 mg Oral Daily Katha Hamming, MD   100 mg at 09/17/15 1211  . nicotine (NICODERM CQ - dosed in mg/24 hours) patch 14 mg  14 mg Transdermal Daily Katha Hamming, MD   14 mg at 09/17/15 1356  . ondansetron (ZOFRAN) tablet 4 mg  4 mg Oral Q6H PRN Houston Siren, MD       Or  . ondansetron (ZOFRAN) injection 4 mg  4 mg Intravenous Q6H PRN Houston Siren, MD      . rivaroxaban (XARELTO) tablet 20 mg  20 mg Oral Q supper Katha Hamming, MD      . sertraline (ZOLOFT) tablet 50 mg  50 mg Oral Daily Houston Siren, MD   50 mg at 09/17/15 0939  . sodium chloride flush (NS) 0.9 % injection 3 mL  3 mL Intravenous Q12H Houston Siren, MD   3 mL at 09/17/15 1000    Musculoskeletal: Strength & Muscle Tone: decreased Gait & Station: ataxic Patient leans: N/A  Psychiatric Specialty Exam: Physical Exam  Nursing note and vitals reviewed. Constitutional: He appears well-developed and well-nourished. He appears distressed.  HENT:  Head: Normocephalic and atraumatic.  Eyes: Conjunctivae are normal. Pupils are equal, round, and reactive to light.  Neck: Normal range of motion.  Cardiovascular: Normal heart sounds.   Respiratory: He is in respiratory distress.  GI: Soft.  Musculoskeletal: Normal range of motion.  Neurological: He is alert.  Skin: Skin is warm and dry.  Psychiatric: His speech is normal. Judgment and thought content normal. His mood appears anxious. He is slowed. Cognition and memory are normal.    Review of Systems  Constitutional: Positive for malaise/fatigue.  HENT: Negative.   Eyes: Negative.   Respiratory: Positive for cough.   Cardiovascular: Negative.   Gastrointestinal:  Negative.   Musculoskeletal: Negative.   Skin: Negative.   Neurological: Negative.   Psychiatric/Behavioral: Positive for substance abuse. Negative for depression, hallucinations, memory loss and suicidal ideas. The patient has insomnia. The patient is not nervous/anxious.     Blood pressure 101/81,  pulse 92, temperature 97.7 F (36.5 C), temperature source Oral, resp. rate (!) 21, height '5\' 10"'$  (1.778 m), weight 104.4 kg (230 lb 3.2 oz), SpO2 98 %.Body mass index is 33.03 kg/m.  General Appearance: Disheveled  Eye Contact:  Minimal  Speech:  Slow  Volume:  Decreased  Mood:  Euthymic  Affect:  Constricted  Thought Process:  Goal Directed  Orientation:  Full (Time, Place, and Person)  Thought Content:  Logical  Suicidal Thoughts:  No  Homicidal Thoughts:  No  Memory:  Immediate;   Good Recent;   Fair Remote;   Fair  Judgement:  Fair  Insight:  Fair  Psychomotor Activity:  Decreased  Concentration:  Concentration: Fair  Recall:  [employer] of Knowledge:  Fair  Language:  Fair  Akathisia:  No  Handed:  Right  AIMS (if indicated):     Assets:  Desire for Improvement Housing Social Support  ADL's:  Intact  Cognition:  WNL  Sleep:        Treatment Plan Summary: Plan 55 year old man who reports that he has a history of opiate abuse. Despite saying that he was using up until yesterday he is denying any current symptoms of opiate withdrawal. He does not appear to be in any physical discomfort. There is no indication for opiate detox. There is really not even any indication for specific symptomatic treatment as he is not complaining of anything physical that his opiate withdrawal. Mood symptoms are not present no suicidality. No need for any medication. Patient should be referred to outpatient substance abuse treatment at discharge. He says he would like to see somebody who can prescribe Suboxone for him. I encouraged him to follow up with [facility] locally and also to look for other  Suboxone providers.  Disposition: Patient does not meet criteria for psychiatric inpatient admission.  Alethia Berthold, MD 09/17/2015 4:45 PM

## 2015-09-17 NOTE — Progress Notes (Signed)
Orders to stop fluids per Dr. Luberta MutterKonidena

## 2015-09-17 NOTE — Progress Notes (Addendum)
[facility] [place] [facility]   PATIENT NAME: Kirk DiesDavid Flessner    MR#:  [id number]  DATE OF BIRTH:  11-18-1960  SUBJECTIVE: Patient admitted for altered mental status at home. Patient was here 2 days ago and left the hospital without signing AMA and also without telling the nurse. Has  Heroin addiction problems. No chest pain. No shortness of breath. Patient main complaint is weight gain of 20 pounds. [facility]. Now he is requesting help to detox from heroin addiction   CHIEF COMPLAINT:   Chief Complaint  Patient presents with  . Seizures    REVIEW OF SYSTEMS:    Review of Systems  Respiratory: Positive for cough and shortness of breath.   Cardiovascular: Negative for chest pain.  Neurological: Positive for tremors.  Psychiatric/Behavioral: The patient is nervous/anxious.     Nutrition:  Tolerating Diet: Tolerating PT:      DRUG ALLERGIES:  No Known Allergies  VITALS:  Blood pressure (!) 153/121, pulse 90, temperature 97.7 F (36.5 C), temperature source Oral, resp. rate (!) 21, height 5\' 10"  (1.778 m), weight 104.4 kg (230 lb 3.2 oz), SpO2 98 %.  PHYSICAL EXAMINATION:   Physical Exam  GENERAL:  55 y.o.-year-old patient lying in the bed with no acute distress. Some tremors noted.  Appears anxious EYES: Pupils equal, round, reactive to light and accommodation. No scleral icterus. Extraocular muscles intact.  HEENT: Head atraumatic, normocephalic. Oropharynx and nasopharynx clear.  NECK:  Supple, no jugular venous distention. No thyroid enlargement, no tenderness.  LUNGS: Normal breath sounds bilaterally, no wheezing, rales,rhonchi or crepitation. No use of accessory muscles of respiration.  CARDIOVASCULAR: S1, S2 normal. No murmurs, rubs, or gallops.  ABDOMEN: Soft, nontender, nondistended. Bowel sounds present. No organomegaly or mass.  EXTREMITIES: l bilateral pedal edema present NEUROLOGIC: Cranial nerves II through  XII are intact. Muscle strength 5/5 in all extremities. Sensation intact. Gait not checked.  PSYCHIATRIC: The patient is alert and oriented x 3.  SKIN: No obvious rash, lesion, or ulcer.    LABORATORY PANEL:   CBC  Recent Labs Lab 09/17/15 0111  WBC 7.4  HGB 11.2*  HCT 34.1*  PLT 327   ------------------------------------------------------------------------------------------------------------------  Chemistries   Recent Labs Lab 09/16/15 1643 09/17/15 0111  NA 135 135  K 3.4* 3.2*  CL 76* 80*  CO2 42* 41*  GLUCOSE 107* 101*  BUN 90* 103*  CREATININE 3.40* 3.21*  CALCIUM 8.6* 8.5*  AST 21  --   ALT 76*  --   ALKPHOS 77  --   BILITOT 1.0  --    ------------------------------------------------------------------------------------------------------------------  Cardiac Enzymes  Recent Labs Lab 09/17/15 0452  TROPONINI 0.13*   ------------------------------------------------------------------------------------------------------------------  RADIOLOGY:  Dg Chest 2 View  Result Date: 09/16/2015 CLINICAL DATA:  seizure. Per ems, Pt walking in hall grunted and fell face down. Per ems, used heroin "small amount" approx. 1330. Pt recently seen here for pneumonia. Smoker, COPD, Afib, HTN, hx heart attack EXAM: CHEST TWO-VIEW COMPARISON:  09/15/2015 FINDINGS: Stable moderate cardiomegaly. Some improvement in the patchy right lower lung airspace infiltrate. Left lung remains clear. No pneumothorax. No effusion. Visualized bones unremarkable. IMPRESSION: 1. Improving right lower lobe airspace disease. 2. Stable cardiomegaly Electronically Signed   By: Corlis Leak  Hassell M.D.   On: 09/16/2015 17:37   Ct Head Wo Contrast  Result Date: 09/16/2015 CLINICAL DATA:  55 year old hypertensive male with seizure. Initial encounter. EXAM: CT HEAD WITHOUT CONTRAST TECHNIQUE: Contiguous axial images were  obtained from the base of the skull through the vertex without intravenous contrast. COMPARISON:   08/16/2015. FINDINGS: Exam is motion degraded. No skull fracture or intracranial hemorrhage. No CT evidence of large acute infarct. No intracranial mass lesion noted on this unenhanced exam. No hydrocephalus. Vascular calcifications. No acute orbital abnormality. Visualized mastoid air cells, middle ear cavities and paranasal sinuses are clear. IMPRESSION: Exam is motion degraded. No skull fracture or intracranial hemorrhage. No CT evidence of large acute infarct. No intracranial mass lesion noted on this unenhanced exam. Electronically Signed   By: Lacy Duverney M.D.   On: 09/16/2015 17:32     ASSESSMENT AND PLAN:   Active Problems:   Acute respiratory failure with hypoxia (HCC)   Acute  respiratory failure with hypoxia secondary to right lower lobe pneumonia: Continue IV antibiotics, nebulizers. Hypoxia at this time on 2 L 98%. #2 history of heart failure with ejection reduced ejection fraction;  [facility] [place] showed EF 20-25%. Follows up with cardiologist at [facility], reviewed the records. Patient is on furosemide 80 mg twice a day, metozalone  2.5 mg Monday Wednesday ,Friday.  He says that he is very complaint with medications. furosemide, Zaroxolyn not on hold at this time because of worsening renal failure. Restart the Lasix at lower dose. But continue to hold Zaroxolyn, losartan, Aldactone. #3 history of atrial fibrillation, patient is on metoprolol XL 100 MG, Xarelto. Continue them. #4 history of V. Tach;monitor on tele,replace k and mg.continue metoprolol #5 Acute on chronic renal failure, chronic kidney disease stage III: Patient not aware of kidney problems. Hold the diuretics, patient received IV hydration. Because of chronic systolic heart failure with EF of 25%  worried about fluid overload ,so I discontinued IV hydration.   6. COPD: Continue to smoke, advised to quit: Continue steroids, nebulizers. History of DVT: On Xarelto. Anxiety, depression on Zoloft. History of   Heroin addiction: Requesting help to come off it. Requests psych consult.    All the records are reviewed and case discussed with Care Management/Social Workerr. Management plans discussed with the patient, family and they are in agreement.  CODE STATUS:full  TOTAL TIME TAKING CARE OF THIS PATIENT:  35 minutes.   POSSIBLE D/C IN 1-2  DAYS, DEPENDING ON CLINICAL CONDITION.   Katha Hamming M.D on 09/17/2015 at 12:04 PM  Between 7am to 6pm - Pager - [phone]  After 6pm go to [email or web address] - password [email or web address] [facility]  [facility]  Office  [phone]  CC: Primary care physician; [facility]

## 2015-09-17 NOTE — Plan of Care (Signed)
Problem: Nutrition: Goal: Adequate nutrition will be maintained Outcome: Progressing Pt has been eating at least 75% of all his meals

## 2015-09-18 LAB — BASIC METABOLIC PANEL
ANION GAP: 14 (ref 5–15)
BUN: 114 mg/dL — AB (ref 6–20)
CO2: 38 mmol/L — ABNORMAL HIGH (ref 22–32)
CREATININE: 2.22 mg/dL — AB (ref 0.61–1.24)
Calcium: 8.8 mg/dL — ABNORMAL LOW (ref 8.9–10.3)
Chloride: 82 mmol/L — ABNORMAL LOW (ref 101–111)
GFR calc Af Amer: 37 mL/min — ABNORMAL LOW (ref >60)
GFR calc non Af Amer: 32 mL/min — ABNORMAL LOW (ref >60)
Glucose, Bld: 181 mg/dL — ABNORMAL HIGH (ref 65–99)
Potassium: 3 mmol/L — ABNORMAL LOW (ref 3.5–5.1)
Sodium: 134 mmol/L — ABNORMAL LOW (ref 135–145)

## 2015-09-18 NOTE — Progress Notes (Signed)
[facility] [place] [facility]   PATIENT NAME: Kirk DiesDavid Cortez    MR#:  [id number]  DATE OF BIRTH:  August 31, 1960  SUBJECTIVE: Has some cough. But feels better than yesterday. Decreased leg edema, decreased shortness of breath.   CHIEF COMPLAINT:   Chief Complaint  Patient presents with  . Seizures    REVIEW OF SYSTEMS:    Review of Systems  Constitutional: Negative for chills and fever.  HENT: Negative for hearing loss.   Eyes: Negative for blurred vision, double vision and photophobia.  Respiratory: Positive for shortness of breath. Negative for cough and hemoptysis.   Cardiovascular: Negative for chest pain, palpitations, orthopnea and leg swelling.  Gastrointestinal: Negative for abdominal pain, diarrhea and vomiting.  Genitourinary: Negative for dysuria and urgency.  Musculoskeletal: Negative for myalgias and neck pain.  Skin: Negative for rash.  Neurological: Negative for dizziness, tremors, focal weakness, seizures, weakness and headaches.  Psychiatric/Behavioral: Negative for memory loss. The patient is nervous/anxious. The patient does not have insomnia.     Nutrition:  Tolerating Diet: Tolerating PT:      DRUG ALLERGIES:  No Known Allergies  VITALS:  Blood pressure 112/67, pulse (!) 106, temperature 98.2 F (36.8 C), temperature source Oral, resp. rate 18, height 5\' 10"  (1.778 m), weight 104.4 kg (230 lb 3.2 oz), SpO2 96 %.  PHYSICAL EXAMINATION:   Physical Exam  GENERAL:  55 y.o.-year-old patient lying in the bed with no acute distress. Some tremors noted.  Appears anxious EYES: Pupils equal, round, reactive to light and accommodation. No scleral icterus. Extraocular muscles intact.  HEENT: Head atraumatic, normocephalic. Oropharynx and nasopharynx clear.  NECK:  Supple, no jugular venous distention. No thyroid enlargement, no tenderness.  LUNGS: Bilateral expiratory wheezing in all lung fields.  CARDIOVASCULAR: S1, S2 normal. No  murmurs, rubs, or gallops.  ABDOMEN: Soft, nontender, nondistended. Bowel sounds present. No organomegaly or mass.  EXTREMITIES: bilateral pedal edema present NEUROLOGIC: Cranial nerves II through XII are intact. Muscle strength 5/5 in all extremities. Sensation intact. Gait not checked.  PSYCHIATRIC: The patient is alert and oriented x 3.  SKIN: No obvious rash, lesion, or ulcer.    LABORATORY PANEL:   CBC  Recent Labs Lab 09/17/15 0111  WBC 7.4  HGB 11.2*  HCT 34.1*  PLT 327   ------------------------------------------------------------------------------------------------------------------  Chemistries   Recent Labs Lab 09/16/15 1643  09/18/15 0411  NA 135  < > 134*  K 3.4*  < > 3.0*  CL 76*  < > 82*  CO2 42*  < > 38*  GLUCOSE 107*  < > 181*  BUN 90*  < > 114*  CREATININE 3.40*  < > 2.22*  CALCIUM 8.6*  < > 8.8*  AST 21  --   --   ALT 76*  --   --   ALKPHOS 77  --   --   BILITOT 1.0  --   --   < > = values in this interval not displayed. ------------------------------------------------------------------------------------------------------------------  Cardiac Enzymes  Recent Labs Lab 09/17/15 0452  TROPONINI 0.13*   ------------------------------------------------------------------------------------------------------------------  RADIOLOGY:  Dg Chest 2 View  Result Date: 09/16/2015 CLINICAL DATA:  seizure. Per ems, Pt walking in hall grunted and fell face down. Per ems, used heroin "small amount" approx. 1330. Pt recently seen here for pneumonia. Smoker, COPD, Afib, HTN, hx heart attack EXAM: CHEST TWO-VIEW COMPARISON:  09/15/2015 FINDINGS: Stable moderate cardiomegaly. Some improvement in the patchy right lower lung airspace infiltrate. Left lung remains  clear. No pneumothorax. No effusion. Visualized bones unremarkable. IMPRESSION: 1. Improving right lower lobe airspace disease. 2. Stable cardiomegaly Electronically Signed   By: Corlis Leak M.D.   On:  09/16/2015 17:37   Ct Head Wo Contrast  Result Date: 09/16/2015 CLINICAL DATA:  55 year old hypertensive male with seizure. Initial encounter. EXAM: CT HEAD WITHOUT CONTRAST TECHNIQUE: Contiguous axial images were obtained from the base of the skull through the vertex without intravenous contrast. COMPARISON:  08/16/2015. FINDINGS: Exam is motion degraded. No skull fracture or intracranial hemorrhage. No CT evidence of large acute infarct. No intracranial mass lesion noted on this unenhanced exam. No hydrocephalus. Vascular calcifications. No acute orbital abnormality. Visualized mastoid air cells, middle ear cavities and paranasal sinuses are clear. IMPRESSION: Exam is motion degraded. No skull fracture or intracranial hemorrhage. No CT evidence of large acute infarct. No intracranial mass lesion noted on this unenhanced exam. Electronically Signed   By: Lacy Duverney M.D.   On: 09/16/2015 17:32     ASSESSMENT AND PLAN:   Active Problems:   Acute respiratory failure with hypoxia (HCC)   Opiate dependence (HCC)   Alcohol abuse, in remission   Acute  respiratory failure with hypoxia secondary to right lower lobe pneumonia:COPD exacerbation. Continue IV antibiotics, nebulizers. Hypoxia at this time on 2 L 98%, continue steroids..  #2 history of heart failure with ejection reduced ejection fraction;  Echo done at [facility] in [place] 2017 showed EF 20-25%. Follows up with cardiologist at [facility], reviewed the records. Patient is on furosemide 80 mg twice a day, metozalone  2.5 mg Monday Wednesday ,Friday.  He says that he is very complaint with medications. furosemide, Zaroxolyn not on hold at this time because of worsening renal failure. Restart the Lasix at lower dose. But continue to hold Zaroxolyn, losartan, Aldactone . #3 history of atrial fibrillation, patient is on metoprolol XL 100 MG, Xarelto. Continue them.  #4 history of V. Tach;monitor on tele,replace k and mg.continue metoprolol  #5 Acute on  chronic renal failure, chronic kidney disease stage III: Patient not aware of kidney problems. ; Kidney function improved. Continue to hold the diuretics except lasix   Because of chronic systolic heart failure with EF of 25%   elevated troponins likely secondary to demand ischemia from respiratory distress. Because of history of the heart failure consult cardiology.  6. COPD: Continue to smoke, advised to quit: Continue steroids, nebulizers.  History of DVT: On Xarelto.  Anxiety, depression on Zoloft.  Opiate addiction. Seen by psychiatric. Patient is given outpatient  BH centers that can give  Suboxone prescription   All the records are reviewed and case discussed with Care Management/Social Workerr. Management plans discussed with the patient, family and they are in agreement.  CODE STATUS:full  TOTAL TIME TAKING CARE OF THIS PATIENT:  35 minutes.   POSSIBLE D/C IN 1-2  DAYS, DEPENDING ON CLINICAL CONDITION.   Katha Hamming M.D on 09/18/2015 at 12:24 PM  Between 7am to 6pm - Pager - [phone]  After 6pm go to [email or web address] - password [email or web address] [facility]  [facility]  Office  [phone]  CC: Primary care physician; [facility]

## 2015-09-18 NOTE — Progress Notes (Addendum)
A&O. Up with assist. No complaints or distress noted. Still on 2L O2. Scoring 0 on CIWA scale. IV antibiotics and solu-medrol given .

## 2015-09-19 ENCOUNTER — Inpatient Hospital Stay: Payer: Medicaid Other

## 2015-09-19 LAB — BASIC METABOLIC PANEL
Anion gap: 16 — ABNORMAL HIGH (ref 5–15)
BUN: 106 mg/dL — AB (ref 6–20)
CHLORIDE: 83 mmol/L — AB (ref 101–111)
CO2: 38 mmol/L — AB (ref 22–32)
Calcium: 9.3 mg/dL (ref 8.9–10.3)
Creatinine, Ser: 1.92 mg/dL — ABNORMAL HIGH (ref 0.61–1.24)
GFR calc Af Amer: 44 mL/min — ABNORMAL LOW (ref >60)
GFR calc non Af Amer: 38 mL/min — ABNORMAL LOW (ref >60)
Glucose, Bld: 168 mg/dL — ABNORMAL HIGH (ref 65–99)
POTASSIUM: 3.5 mmol/L (ref 3.5–5.1)
SODIUM: 137 mmol/L (ref 135–145)

## 2015-09-19 MED ORDER — PREDNISONE 10 MG PO TABS: 10.0000 mg | ORAL_TABLET | Freq: Every day | ORAL | 0 refills | Status: DC

## 2015-09-19 MED ORDER — NICOTINE 14 MG/24HR TD PT24: 14.0000 mg | MEDICATED_PATCH | Freq: Every day | TRANSDERMAL | 0 refills | Status: DC

## 2015-09-19 MED ORDER — FUROSEMIDE 20 MG PO TABS: 20.0000 mg | ORAL_TABLET | Freq: Two times a day (BID) | ORAL | 0 refills | Status: DC

## 2015-09-19 MED ORDER — AZITHROMYCIN 500 MG PO TABS: 500.0000 mg | ORAL_TABLET | Freq: Every day | ORAL | 0 refills | Status: AC

## 2015-09-19 NOTE — Progress Notes (Signed)
Patient given discharge teaching and paperwork regarding medications, diet, follow-up appointments and activity. Patient understanding verbalized. No complaints at this time. IV and telemetry discontinued prior to leaving. Skin assessment as previously charted and vitals are stable; on room air. Patient being discharged to home. Sister present during discharge teaching. Patient ready to leave right now, does not want to wait to for anything else. Print prescriptions given to patient.

## 2015-09-19 NOTE — Progress Notes (Signed)
Patient saying he is leaving at 9:15 whether he sees an MD this morning or not. Dr. Juliene PinaMody aware, she will round on patient shortly. Patient also refusing bed alarm - educated on safety. Agreed to call before getting up. Sister at bedside agrees as well. Will continue to monitor.

## 2015-09-19 NOTE — Progress Notes (Signed)
Patient found to be 70% on RA. 2L O2 placed. Sats up to low 90's.

## 2015-09-19 NOTE — Discharge Summary (Signed)
[facility] [place] [facility]   PATIENT NAME: Kirk DiesDavid Cortez    MR#:  [id number]  DATE OF BIRTH:  03/15/1960  DATE OF ADMISSION:  09/16/2015 ADMITTING PHYSICIAN: Houston SirenVivek J Sainani, MD  DATE OF DISCHARGE: 09/19/2015  PRIMARY CARE PHYSICIAN: [facility]    ADMISSION DIAGNOSIS:  CAP (community acquired pneumonia) [J18.9] Acute respiratory failure with hypoxia (HCC) [J96.01] Acute renal failure, unspecified acute renal failure type (HCC) [N17.9]  DISCHARGE DIAGNOSIS:  Active Problems:   Acute respiratory failure with hypoxia (HCC)   Opiate dependence (HCC)   Alcohol abuse, in remission   SECONDARY DIAGNOSIS:   Past Medical History:  Diagnosis Date  . A-fib (HCC)   . CHF (congestive heart failure) (HCC)   . COPD (chronic obstructive pulmonary disease) (HCC)   . Heart attack (HCC)   . Heart murmur   . Hypertension     HOSPITAL COURSE:   55 year old male with history of tobacco dependence and chronic systolic heart failure who presented with chief complaint of recurrent responsiveness and found to have pneumonia.  1. Acute metabolic encephalopathy with altered mental status and unresponsiveness: Patient's head CT showed no evidence of acute pathology. Etiology is most likely consistent with IV drug abuse.  2. Acute hypoxic respiratory failure in the setting of COPD and community-acquired pneumonia. Patient was treated with IV steroids, nebulizers and inhalers. He was also treated with Rocephin and azithromycin for pneumonia. His symptoms have improved. He is 96% on room air at discharge.  3. CAP: Patient was on azithromycin and ceftriaxone. Patient will be discharged on azithromycin.  4. Acute kidney injury: This is secondary to hypotension and dehydration. Creatinine has improved significantly with IV fluids. Patient will continue to hold hold nephrotoxic medications. He has follow up on Tuesday with his PCP.  5. Chronic combined diastolic and  systolic heart failure: EF 25% Due to problem number for diuretics and ARB inhibitor have been discontinued for now. He needs close follow-up with his cardiologist and restart these medications creatinine stabilizes.  6. Elevated troponin: This is due to demand ischemia and not ACS.  7. History of atrial fibrillation: Heart rate is better controlled. Patient will resume beta blocker  And XARELTO.  8. Tobacco dependence: Patient was encouraged to stop. Patient counseled for 3 minutes. DISCHARGE CONDITIONS AND DIET:  Stable Heart healthy diet  CONSULTS OBTAINED:  Treatment Team:  Audery AmelJohn T Clapacs, MD  DRUG ALLERGIES:  No Known Allergies  DISCHARGE MEDICATIONS:   Discharge Medication List as of 09/19/2015  8:33 AM    START taking these medications   Details  azithromycin (ZITHROMAX) 500 MG tablet Take 1 tablet (500 mg total) by mouth daily., Starting Sat 09/19/2015, Until Wed 09/23/2015, Normal    furosemide (LASIX) 20 MG tablet Take 1 tablet (20 mg total) by mouth 2 (two) times daily., Starting Sat 09/19/2015, Normal    nicotine (NICODERM CQ - DOSED IN MG/24 HOURS) 14 mg/24hr patch Place 1 patch (14 mg total) onto the skin daily., Starting Sat 09/19/2015, Normal    predniSONE (DELTASONE) 10 MG tablet Take 1 tablet (10 mg total) by mouth daily with breakfast., Starting Sat 09/19/2015, Print      CONTINUE these medications which have NOT CHANGED   Details  albuterol (ACCUNEB) 1.25 MG/3ML nebulizer solution Take 1.25 mg by nebulization every 6 (six) hours as needed., Starting Thu 08/06/2015, Until Fri 08/05/2016, Historical Med    aspirin EC 81 MG tablet Take 81 mg by mouth daily., Starting Thu 04/11/2013,  Historical Med    atorvastatin (LIPITOR) 80 MG tablet Take 80 mg by mouth every evening., Starting Thu 08/06/2015, Historical Med    famotidine (PEPCID) 20 MG tablet Take 20 mg by mouth 2 (two) times daily., Starting Thu 08/06/2015, Historical Med    magnesium oxide (MAG-OX) 400 MG tablet  Take 400 mg by mouth 2 (two) times daily., Starting Thu 07/02/2015, Until Fri 07/01/2016, Historical Med    metoprolol succinate (TOPROL-XL) 100 MG 24 hr tablet Take 100 mg by mouth daily., Starting Thu 08/06/2015, Historical Med    nitroGLYCERIN (NITROSTAT) 0.4 MG SL tablet Place 0.4 mg under the tongue every 5 (five) minutes as needed., Historical Med    rivaroxaban (XARELTO) 20 MG TABS tablet Take 20 mg by mouth daily with supper., Starting Thu 08/06/2015, Until Fri 08/05/2016, Historical Med    sertraline (ZOLOFT) 50 MG tablet Take 50 mg by mouth daily., Starting Thu 08/06/2015, Until Fri 08/05/2016, Historical Med      STOP taking these medications     losartan (COZAAR) 25 MG tablet      metolazone (ZAROXOLYN) 2.5 MG tablet      spironolactone (ALDACTONE) 25 MG tablet               Today   CHIEF COMPLAINT:  Doing well ready for discharge   VITAL SIGNS:  Blood pressure 105/90, pulse (!) 119, temperature 97.8 F (36.6 C), temperature source Oral, resp. rate 16, height 5\' 10"  (1.778 m), weight 104.4 kg (230 lb 3.2 oz), SpO2 100 %.   REVIEW OF SYSTEMS:  Review of Systems  Constitutional: Negative.  Negative for chills, fever and malaise/fatigue.  HENT: Negative.  Negative for ear discharge, ear pain, hearing loss, nosebleeds and sore throat.   Eyes: Negative.  Negative for blurred vision and pain.  Respiratory: Negative.  Negative for cough, hemoptysis, shortness of breath and wheezing.   Cardiovascular: Negative.  Negative for chest pain, palpitations and leg swelling.  Gastrointestinal: Negative.  Negative for abdominal pain, blood in stool, diarrhea, nausea and vomiting.  Genitourinary: Negative.  Negative for dysuria.  Musculoskeletal: Negative.  Negative for back pain.  Skin: Negative.   Neurological: Negative for dizziness, tremors, speech change, focal weakness, seizures and headaches.  Endo/Heme/Allergies: Negative.  Does not bruise/bleed easily.   Psychiatric/Behavioral: Negative.  Negative for depression, hallucinations and suicidal ideas.     PHYSICAL EXAMINATION:  GENERAL:  55 y.o.-year-old patient lying in the bed with no acute distress.  NECK:  Supple, no jugular venous distention. No thyroid enlargement, no tenderness.  LUNGS: Normal breath sounds bilaterally, no wheezing, rales,rhonchi  No use of accessory muscles of respiration.  CARDIOVASCULAR: S1, S2 normal. No murmurs, rubs, or gallops.  ABDOMEN: Soft, non-tender, non-distended. Bowel sounds present. No organomegaly or mass.  EXTREMITIES: No pedal edema, cyanosis, or clubbing.  PSYCHIATRIC: The patient is alert and oriented x 3.  SKIN: No obvious rash, lesion, or ulcer.   DATA REVIEW:   CBC  Recent Labs Lab 09/17/15 0111  WBC 7.4  HGB 11.2*  HCT 34.1*  PLT 327    Chemistries   Recent Labs Lab 09/16/15 1643  09/19/15 0459  NA 135  < > 137  K 3.4*  < > 3.5  CL 76*  < > 83*  CO2 42*  < > 38*  GLUCOSE 107*  < > 168*  BUN 90*  < > 106*  CREATININE 3.40*  < > 1.92*  CALCIUM 8.6*  < > 9.3  AST 21  --   --  ALT 76*  --   --   ALKPHOS 77  --   --   BILITOT 1.0  --   --   < > = values in this interval not displayed.  Cardiac Enzymes  Recent Labs Lab 09/16/15 2145 09/17/15 0111 09/17/15 0452  TROPONINI 0.14* 0.13* 0.13*    Microbiology Results  @  RADIOLOGY:  Dg Chest 1 View  Result Date: 09/19/2015 CLINICAL DATA:  Patient with history of congestive heart failure. EXAM: CHEST 1 VIEW COMPARISON:  Chest radiograph 09/16/2015. FINDINGS: Monitoring leads overlie the patient. Stable cardiomegaly. Pulmonary vascular redistribution. Mild heterogeneous opacities right lung base. No pleural effusion or pneumothorax. IMPRESSION: Mild heterogeneous opacities right lung base favored to represent atelectasis. Cardiomegaly and pulmonary vascular redistribution. Electronically Signed   By: Annia Belt M.D.   On: 09/19/2015 07:58       Management plans discussed with the patient and he is in agreement. Stable for discharge home  Patient should follow up with pcp   CODE STATUS:  Code Status History    Date Active Date Inactive Code Status Order ID Comments User Context   09/16/2015  9:28 PM 09/19/2015 11:59 AM Full Code [id number]  Houston Siren, MD Inpatient   09/15/2015  5:51 AM 09/15/2015 12:06 PM Full Code [id number]  Ihor Austin, MD Inpatient      TOTAL TIME TAKING CARE OF THIS PATIENT: 38 minutes.    Note: This dictation was prepared with Dragon dictation along with smaller phrase technology. Any transcriptional errors that result from this process are unintentional.  Charleene Callegari M.D on 09/19/2015 at 1:03 PM  Between 7am to 6pm - Pager - [phone] After 6pm go to [email or web address] - Social research officer, government  [facility]  Office  [phone]  CC: Primary care physician; [facility]

## 2015-09-20 LAB — CULTURE, BLOOD (ROUTINE X 2)
CULTURE: NO GROWTH
Culture: NO GROWTH

## 2016-01-14 DIAGNOSIS — G9340 Encephalopathy, unspecified: Secondary | ICD-10-CM | POA: Insufficient documentation

## 2016-01-30 DIAGNOSIS — Z86711 Personal history of pulmonary embolism: Secondary | ICD-10-CM | POA: Insufficient documentation

## 2016-02-01 DIAGNOSIS — J181 Lobar pneumonia, unspecified organism: Secondary | ICD-10-CM

## 2016-02-01 DIAGNOSIS — J189 Pneumonia, unspecified organism: Secondary | ICD-10-CM | POA: Insufficient documentation

## 2016-03-15 ENCOUNTER — Inpatient Hospital Stay
Admission: EM | Admit: 2016-03-15 | Discharge: 2016-03-18 | DRG: 314 | Disposition: A | Discharge status: Home / Self Care | Payer: Medicaid Other | Attending: Internal Medicine | Admitting: Internal Medicine

## 2016-03-15 ENCOUNTER — Encounter: Payer: Self-pay | Admitting: Emergency Medicine

## 2016-03-15 DIAGNOSIS — I700 Atherosclerosis of aorta: Secondary | ICD-10-CM | POA: Diagnosis present

## 2016-03-15 DIAGNOSIS — I959 Hypotension, unspecified: Principal | ICD-10-CM

## 2016-03-15 DIAGNOSIS — Z716 Tobacco abuse counseling: Secondary | ICD-10-CM

## 2016-03-15 DIAGNOSIS — I472 Ventricular tachycardia, unspecified: Secondary | ICD-10-CM

## 2016-03-15 DIAGNOSIS — Y901 Blood alcohol level of 20-39 mg/100 ml: Secondary | ICD-10-CM | POA: Diagnosis present

## 2016-03-15 DIAGNOSIS — Z9114 Patient's other noncompliance with medication regimen: Secondary | ICD-10-CM

## 2016-03-15 DIAGNOSIS — Z8249 Family history of ischemic heart disease and other diseases of the circulatory system: Secondary | ICD-10-CM

## 2016-03-15 DIAGNOSIS — N179 Acute kidney failure, unspecified: Secondary | ICD-10-CM | POA: Diagnosis present

## 2016-03-15 DIAGNOSIS — E785 Hyperlipidemia, unspecified: Secondary | ICD-10-CM | POA: Diagnosis present

## 2016-03-15 DIAGNOSIS — N189 Chronic kidney disease, unspecified: Secondary | ICD-10-CM | POA: Diagnosis present

## 2016-03-15 DIAGNOSIS — R569 Unspecified convulsions: Secondary | ICD-10-CM

## 2016-03-15 DIAGNOSIS — J189 Pneumonia, unspecified organism: Secondary | ICD-10-CM | POA: Diagnosis present

## 2016-03-15 DIAGNOSIS — I248 Other forms of acute ischemic heart disease: Secondary | ICD-10-CM | POA: Diagnosis present

## 2016-03-15 DIAGNOSIS — I5023 Acute on chronic systolic (congestive) heart failure: Secondary | ICD-10-CM

## 2016-03-15 DIAGNOSIS — F1010 Alcohol abuse, uncomplicated: Secondary | ICD-10-CM

## 2016-03-15 DIAGNOSIS — F17200 Nicotine dependence, unspecified, uncomplicated: Secondary | ICD-10-CM | POA: Diagnosis present

## 2016-03-15 DIAGNOSIS — I071 Rheumatic tricuspid insufficiency: Secondary | ICD-10-CM

## 2016-03-15 DIAGNOSIS — H538 Other visual disturbances: Secondary | ICD-10-CM | POA: Diagnosis present

## 2016-03-15 DIAGNOSIS — K219 Gastro-esophageal reflux disease without esophagitis: Secondary | ICD-10-CM | POA: Diagnosis present

## 2016-03-15 DIAGNOSIS — F1910 Other psychoactive substance abuse, uncomplicated: Secondary | ICD-10-CM

## 2016-03-15 DIAGNOSIS — R1011 Right upper quadrant pain: Secondary | ICD-10-CM

## 2016-03-15 DIAGNOSIS — I340 Nonrheumatic mitral (valve) insufficiency: Secondary | ICD-10-CM

## 2016-03-15 DIAGNOSIS — R42 Dizziness and giddiness: Secondary | ICD-10-CM | POA: Diagnosis present

## 2016-03-15 DIAGNOSIS — F1110 Opioid abuse, uncomplicated: Secondary | ICD-10-CM | POA: Diagnosis present

## 2016-03-15 DIAGNOSIS — F1410 Cocaine abuse, uncomplicated: Secondary | ICD-10-CM

## 2016-03-15 DIAGNOSIS — I252 Old myocardial infarction: Secondary | ICD-10-CM

## 2016-03-15 DIAGNOSIS — Z9111 Patient's noncompliance with dietary regimen: Secondary | ICD-10-CM

## 2016-03-15 DIAGNOSIS — R0902 Hypoxemia: Secondary | ICD-10-CM | POA: Diagnosis present

## 2016-03-15 DIAGNOSIS — J440 Chronic obstructive pulmonary disease with acute lower respiratory infection: Secondary | ICD-10-CM | POA: Diagnosis present

## 2016-03-15 DIAGNOSIS — D62 Acute posthemorrhagic anemia: Secondary | ICD-10-CM

## 2016-03-15 DIAGNOSIS — Z79899 Other long term (current) drug therapy: Secondary | ICD-10-CM

## 2016-03-15 DIAGNOSIS — Z7982 Long term (current) use of aspirin: Secondary | ICD-10-CM

## 2016-03-15 DIAGNOSIS — I4891 Unspecified atrial fibrillation: Secondary | ICD-10-CM

## 2016-03-15 DIAGNOSIS — I130 Hypertensive heart and chronic kidney disease with heart failure and stage 1 through stage 4 chronic kidney disease, or unspecified chronic kidney disease: Secondary | ICD-10-CM | POA: Diagnosis present

## 2016-03-15 DIAGNOSIS — Z86711 Personal history of pulmonary embolism: Secondary | ICD-10-CM

## 2016-03-15 DIAGNOSIS — R55 Syncope and collapse: Secondary | ICD-10-CM

## 2016-03-15 DIAGNOSIS — J449 Chronic obstructive pulmonary disease, unspecified: Secondary | ICD-10-CM | POA: Diagnosis present

## 2016-03-15 DIAGNOSIS — I2729 Other secondary pulmonary hypertension: Secondary | ICD-10-CM | POA: Diagnosis present

## 2016-03-15 DIAGNOSIS — I2510 Atherosclerotic heart disease of native coronary artery without angina pectoris: Secondary | ICD-10-CM | POA: Diagnosis present

## 2016-03-15 DIAGNOSIS — R7989 Other specified abnormal findings of blood chemistry: Secondary | ICD-10-CM

## 2016-03-15 DIAGNOSIS — K921 Melena: Secondary | ICD-10-CM

## 2016-03-15 DIAGNOSIS — R578 Other shock: Secondary | ICD-10-CM | POA: Diagnosis present

## 2016-03-15 DIAGNOSIS — I482 Chronic atrial fibrillation: Secondary | ICD-10-CM | POA: Diagnosis present

## 2016-03-15 DIAGNOSIS — I639 Cerebral infarction, unspecified: Secondary | ICD-10-CM

## 2016-03-15 DIAGNOSIS — R778 Other specified abnormalities of plasma proteins: Secondary | ICD-10-CM

## 2016-03-15 DIAGNOSIS — I4729 Other ventricular tachycardia: Secondary | ICD-10-CM

## 2016-03-15 DIAGNOSIS — I272 Pulmonary hypertension, unspecified: Secondary | ICD-10-CM

## 2016-03-15 DIAGNOSIS — Z9119 Patient's noncompliance with other medical treatment and regimen: Secondary | ICD-10-CM

## 2016-03-15 DIAGNOSIS — D649 Anemia, unspecified: Secondary | ICD-10-CM

## 2016-03-15 DIAGNOSIS — E871 Hypo-osmolality and hyponatremia: Secondary | ICD-10-CM

## 2016-03-15 DIAGNOSIS — I081 Rheumatic disorders of both mitral and tricuspid valves: Secondary | ICD-10-CM | POA: Diagnosis present

## 2016-03-15 DIAGNOSIS — I420 Dilated cardiomyopathy: Secondary | ICD-10-CM

## 2016-03-15 HISTORY — DX: Other cardiomyopathies: I42.8

## 2016-03-15 HISTORY — DX: Personal history of pulmonary embolism: Z86.711

## 2016-03-15 HISTORY — DX: Other psychoactive substance abuse, uncomplicated: F19.10

## 2016-03-15 HISTORY — DX: Gastrointestinal hemorrhage, unspecified: K92.2

## 2016-03-15 HISTORY — DX: Chronic systolic (congestive) heart failure: I50.22

## 2016-03-15 HISTORY — DX: Chronic atrial fibrillation, unspecified: I48.20

## 2016-03-15 HISTORY — DX: Nonrheumatic mitral (valve) insufficiency: I34.0

## 2016-03-15 HISTORY — DX: Personal history of other venous thrombosis and embolism: Z86.718

## 2016-03-15 HISTORY — DX: Atherosclerotic heart disease of native coronary artery without angina pectoris: I25.10

## 2016-03-15 HISTORY — DX: Unspecified asthma, uncomplicated: J45.909

## 2016-03-15 HISTORY — DX: Hyperlipidemia, unspecified: E78.5

## 2016-03-15 NOTE — ED Triage Notes (Signed)
Pt arrived by EMS from "cookout" where he had a syncopal episode. EMS reports pt was snoring upon arrival, lethargic but came around once stimulated. ETOH on board. Pt has HX of COPD, CHF and previous MI.

## 2016-03-16 ENCOUNTER — Emergency Department: Payer: Medicaid Other

## 2016-03-16 ENCOUNTER — Inpatient Hospital Stay: Payer: Medicaid Other

## 2016-03-16 DIAGNOSIS — I482 Chronic atrial fibrillation: Secondary | ICD-10-CM | POA: Diagnosis present

## 2016-03-16 DIAGNOSIS — I248 Other forms of acute ischemic heart disease: Secondary | ICD-10-CM | POA: Diagnosis present

## 2016-03-16 DIAGNOSIS — I130 Hypertensive heart and chronic kidney disease with heart failure and stage 1 through stage 4 chronic kidney disease, or unspecified chronic kidney disease: Secondary | ICD-10-CM | POA: Diagnosis present

## 2016-03-16 DIAGNOSIS — J449 Chronic obstructive pulmonary disease, unspecified: Secondary | ICD-10-CM | POA: Diagnosis present

## 2016-03-16 DIAGNOSIS — R55 Syncope and collapse: Secondary | ICD-10-CM | POA: Diagnosis not present

## 2016-03-16 DIAGNOSIS — R578 Other shock: Secondary | ICD-10-CM | POA: Diagnosis present

## 2016-03-16 DIAGNOSIS — I509 Heart failure, unspecified: Secondary | ICD-10-CM | POA: Diagnosis not present

## 2016-03-16 DIAGNOSIS — I2729 Other secondary pulmonary hypertension: Secondary | ICD-10-CM | POA: Diagnosis present

## 2016-03-16 DIAGNOSIS — I959 Hypotension, unspecified: Secondary | ICD-10-CM | POA: Diagnosis present

## 2016-03-16 DIAGNOSIS — N189 Chronic kidney disease, unspecified: Secondary | ICD-10-CM | POA: Diagnosis present

## 2016-03-16 DIAGNOSIS — I420 Dilated cardiomyopathy: Secondary | ICD-10-CM | POA: Diagnosis present

## 2016-03-16 DIAGNOSIS — I4891 Unspecified atrial fibrillation: Secondary | ICD-10-CM | POA: Diagnosis not present

## 2016-03-16 DIAGNOSIS — I951 Orthostatic hypotension: Secondary | ICD-10-CM | POA: Diagnosis not present

## 2016-03-16 DIAGNOSIS — D62 Acute posthemorrhagic anemia: Secondary | ICD-10-CM | POA: Diagnosis not present

## 2016-03-16 DIAGNOSIS — F1110 Opioid abuse, uncomplicated: Secondary | ICD-10-CM | POA: Diagnosis present

## 2016-03-16 DIAGNOSIS — R569 Unspecified convulsions: Secondary | ICD-10-CM | POA: Diagnosis present

## 2016-03-16 DIAGNOSIS — J189 Pneumonia, unspecified organism: Secondary | ICD-10-CM | POA: Diagnosis present

## 2016-03-16 DIAGNOSIS — F17200 Nicotine dependence, unspecified, uncomplicated: Secondary | ICD-10-CM | POA: Diagnosis present

## 2016-03-16 DIAGNOSIS — N179 Acute kidney failure, unspecified: Secondary | ICD-10-CM | POA: Diagnosis present

## 2016-03-16 DIAGNOSIS — I472 Ventricular tachycardia: Secondary | ICD-10-CM | POA: Diagnosis present

## 2016-03-16 DIAGNOSIS — E871 Hypo-osmolality and hyponatremia: Secondary | ICD-10-CM | POA: Diagnosis present

## 2016-03-16 DIAGNOSIS — I252 Old myocardial infarction: Secondary | ICD-10-CM | POA: Diagnosis not present

## 2016-03-16 DIAGNOSIS — J440 Chronic obstructive pulmonary disease with acute lower respiratory infection: Secondary | ICD-10-CM | POA: Diagnosis present

## 2016-03-16 DIAGNOSIS — K921 Melena: Secondary | ICD-10-CM | POA: Diagnosis not present

## 2016-03-16 DIAGNOSIS — I5023 Acute on chronic systolic (congestive) heart failure: Secondary | ICD-10-CM | POA: Diagnosis present

## 2016-03-16 DIAGNOSIS — Y901 Blood alcohol level of 20-39 mg/100 ml: Secondary | ICD-10-CM | POA: Diagnosis present

## 2016-03-16 DIAGNOSIS — I2510 Atherosclerotic heart disease of native coronary artery without angina pectoris: Secondary | ICD-10-CM | POA: Diagnosis present

## 2016-03-16 DIAGNOSIS — F1910 Other psychoactive substance abuse, uncomplicated: Secondary | ICD-10-CM | POA: Diagnosis present

## 2016-03-16 LAB — HEPATIC FUNCTION PANEL
ALBUMIN: 3.1 g/dL — AB (ref 3.5–5.0)
ALT: 19 U/L (ref 17–63)
AST: 13 U/L — AB (ref 15–41)
Alkaline Phosphatase: 81 U/L (ref 38–126)
BILIRUBIN TOTAL: 0.9 mg/dL (ref 0.3–1.2)
Bilirubin, Direct: 0.3 mg/dL (ref 0.1–0.5)
Indirect Bilirubin: 0.6 mg/dL (ref 0.3–0.9)
Total Protein: 7 g/dL (ref 6.5–8.1)

## 2016-03-16 LAB — URINE DRUG SCREEN, QUALITATIVE (ARMC ONLY)
Amphetamines, Ur Screen: NOT DETECTED
BARBITURATES, UR SCREEN: NOT DETECTED
Benzodiazepine, Ur Scrn: POSITIVE — AB
COCAINE METABOLITE, UR ~~LOC~~: POSITIVE — AB
Cannabinoid 50 Ng, Ur ~~LOC~~: NOT DETECTED
MDMA (Ecstasy)Ur Screen: NOT DETECTED
METHADONE SCREEN, URINE: NOT DETECTED
Opiate, Ur Screen: POSITIVE — AB
Phencyclidine (PCP) Ur S: NOT DETECTED
TRICYCLIC, UR SCREEN: POSITIVE — AB

## 2016-03-16 LAB — URINALYSIS, COMPLETE (UACMP) WITH MICROSCOPIC
BILIRUBIN URINE: NEGATIVE
Glucose, UA: NEGATIVE mg/dL
Hgb urine dipstick: NEGATIVE
Ketones, ur: NEGATIVE mg/dL
LEUKOCYTES UA: NEGATIVE
Nitrite: NEGATIVE
Protein, ur: 100 mg/dL — AB
SPECIFIC GRAVITY, URINE: 1.013 (ref 1.005–1.030)
pH: 5 (ref 5.0–8.0)

## 2016-03-16 LAB — HEMOGLOBIN AND HEMATOCRIT, BLOOD
HEMATOCRIT: 29.5 % — AB (ref 40.0–52.0)
Hemoglobin: 9.1 g/dL — ABNORMAL LOW (ref 13.0–18.0)

## 2016-03-16 LAB — BASIC METABOLIC PANEL
Anion gap: 14 (ref 5–15)
Anion gap: 9 (ref 5–15)
BUN: 80 mg/dL — ABNORMAL HIGH (ref 6–20)
BUN: 86 mg/dL — AB (ref 6–20)
CHLORIDE: 101 mmol/L (ref 101–111)
CHLORIDE: 98 mmol/L — AB (ref 101–111)
CO2: 22 mmol/L (ref 22–32)
CO2: 23 mmol/L (ref 22–32)
Calcium: 8.2 mg/dL — ABNORMAL LOW (ref 8.9–10.3)
Calcium: 8.6 mg/dL — ABNORMAL LOW (ref 8.9–10.3)
Creatinine, Ser: 2.28 mg/dL — ABNORMAL HIGH (ref 0.61–1.24)
Creatinine, Ser: 2.54 mg/dL — ABNORMAL HIGH (ref 0.61–1.24)
GFR calc Af Amer: 31 mL/min — ABNORMAL LOW (ref >60)
GFR calc non Af Amer: 27 mL/min — ABNORMAL LOW (ref >60)
GFR, EST AFRICAN AMERICAN: 35 mL/min — AB (ref >60)
GFR, EST NON AFRICAN AMERICAN: 30 mL/min — AB (ref >60)
GLUCOSE: 106 mg/dL — AB (ref 65–99)
Glucose, Bld: 108 mg/dL — ABNORMAL HIGH (ref 65–99)
POTASSIUM: 4.6 mmol/L (ref 3.5–5.1)
POTASSIUM: 4.6 mmol/L (ref 3.5–5.1)
SODIUM: 133 mmol/L — AB (ref 135–145)
Sodium: 134 mmol/L — ABNORMAL LOW (ref 135–145)

## 2016-03-16 LAB — CBC
HCT: 24.2 % — ABNORMAL LOW (ref 40.0–52.0)
HEMATOCRIT: 24.2 % — AB (ref 40.0–52.0)
Hemoglobin: 7.3 g/dL — ABNORMAL LOW (ref 13.0–18.0)
Hemoglobin: 7.5 g/dL — ABNORMAL LOW (ref 13.0–18.0)
MCH: 23.2 pg — AB (ref 26.0–34.0)
MCH: 23.9 pg — AB (ref 26.0–34.0)
MCHC: 30 g/dL — AB (ref 32.0–36.0)
MCHC: 30.9 g/dL — ABNORMAL LOW (ref 32.0–36.0)
MCV: 77.2 fL — AB (ref 80.0–100.0)
MCV: 77.3 fL — AB (ref 80.0–100.0)
PLATELETS: 422 10*3/uL (ref 150–440)
Platelets: 468 10*3/uL — ABNORMAL HIGH (ref 150–440)
RBC: 3.13 MIL/uL — AB (ref 4.40–5.90)
RBC: 3.14 MIL/uL — ABNORMAL LOW (ref 4.40–5.90)
RDW: 22 % — AB (ref 11.5–14.5)
RDW: 22.2 % — ABNORMAL HIGH (ref 11.5–14.5)
WBC: 9.1 10*3/uL (ref 3.8–10.6)
WBC: 9.6 10*3/uL (ref 3.8–10.6)

## 2016-03-16 LAB — TROPONIN I
TROPONIN I: 0.03 ng/mL — AB (ref <0.03)
TROPONIN I: 0.03 ng/mL — AB (ref <0.03)
TROPONIN I: 0.04 ng/mL — AB (ref <0.03)
Troponin I: <0.03 ng/mL (ref <0.03)

## 2016-03-16 LAB — MRSA PCR SCREENING: MRSA by PCR: NEGATIVE

## 2016-03-16 LAB — LACTIC ACID, PLASMA
LACTIC ACID, VENOUS: 0.8 mmol/L (ref 0.5–1.9)
Lactic Acid, Venous: 2.3 mmol/L (ref 0.5–1.9)

## 2016-03-16 LAB — PROTIME-INR
INR: 1.55
Prothrombin Time: 18.7 seconds — ABNORMAL HIGH (ref 11.4–15.2)

## 2016-03-16 LAB — ETHANOL: Alcohol, Ethyl (B): 33 mg/dL — ABNORMAL HIGH (ref <5)

## 2016-03-16 LAB — ABO/RH: ABO/RH(D): A POS

## 2016-03-16 LAB — BRAIN NATRIURETIC PEPTIDE: B Natriuretic Peptide: 1011 pg/mL — ABNORMAL HIGH (ref 0.0–100.0)

## 2016-03-16 LAB — GLUCOSE, CAPILLARY: Glucose-Capillary: 112 mg/dL — ABNORMAL HIGH (ref 65–99)

## 2016-03-16 LAB — PREPARE RBC (CROSSMATCH)

## 2016-03-16 LAB — DIGOXIN LEVEL: DIGOXIN LVL: 0.7 ng/mL — AB (ref 0.8–2.0)

## 2016-03-16 MED ORDER — THIAMINE HCL 100 MG/ML IJ SOLN
100.0000 mg | Freq: Every day | INTRAMUSCULAR | Status: DC
  Administered 2016-03-16 – 2016-03-18 (×2): 100 mg via INTRAVENOUS
  Filled 2016-03-16 (×2): qty 2

## 2016-03-16 MED ORDER — ATORVASTATIN CALCIUM 20 MG PO TABS
80.0000 mg | ORAL_TABLET | Freq: Every evening | ORAL | Status: DC
  Administered 2016-03-16 – 2016-03-18 (×3): 80 mg via ORAL
  Filled 2016-03-16 (×3): qty 4

## 2016-03-16 MED ORDER — LORAZEPAM 2 MG/ML IJ SOLN
INTRAMUSCULAR | Status: AC
  Administered 2016-03-16: 1 mg via INTRAVENOUS
  Filled 2016-03-16: qty 1

## 2016-03-16 MED ORDER — AMIODARONE LOAD VIA INFUSION
150.0000 mg | Freq: Once | INTRAVENOUS | Status: DC
  Filled 2016-03-16: qty 83.34

## 2016-03-16 MED ORDER — SODIUM CHLORIDE 0.9 % IV BOLUS (SEPSIS)
1000.0000 mL | Freq: Once | INTRAVENOUS | Status: AC
  Administered 2016-03-16: 1000 mL via INTRAVENOUS

## 2016-03-16 MED ORDER — LORAZEPAM 2 MG/ML IJ SOLN
0.0000 mg | Freq: Four times a day (QID) | INTRAMUSCULAR | Status: AC
  Administered 2016-03-17: 1 mg via INTRAVENOUS

## 2016-03-16 MED ORDER — ALBUTEROL SULFATE (2.5 MG/3ML) 0.083% IN NEBU: 2.5000 mg | INHALATION_SOLUTION | Freq: Four times a day (QID) | RESPIRATORY_TRACT | Status: DC | PRN

## 2016-03-16 MED ORDER — ACETAMINOPHEN 650 MG RE SUPP: 650.0000 mg | Freq: Four times a day (QID) | RECTAL | Status: DC | PRN

## 2016-03-16 MED ORDER — SODIUM CHLORIDE 0.9 % IV SOLN
10.0000 mL/h | Freq: Once | INTRAVENOUS | Status: AC
  Administered 2016-03-16: 10 mL/h via INTRAVENOUS

## 2016-03-16 MED ORDER — SODIUM CHLORIDE 0.9% FLUSH
3.0000 mL | Freq: Two times a day (BID) | INTRAVENOUS | Status: DC
  Administered 2016-03-16 – 2016-03-18 (×4): 3 mL via INTRAVENOUS

## 2016-03-16 MED ORDER — IPRATROPIUM BROMIDE HFA 17 MCG/ACT IN AERS: 2.0000 | INHALATION_SPRAY | Freq: Four times a day (QID) | RESPIRATORY_TRACT | Status: DC

## 2016-03-16 MED ORDER — LORAZEPAM 1 MG PO TABS: 1.0000 mg | ORAL_TABLET | Freq: Four times a day (QID) | ORAL | Status: DC | PRN

## 2016-03-16 MED ORDER — ALBUTEROL SULFATE 1.25 MG/3ML IN NEBU: 1.2500 mg | INHALATION_SOLUTION | Freq: Four times a day (QID) | RESPIRATORY_TRACT | Status: DC | PRN

## 2016-03-16 MED ORDER — DIGOXIN 0.25 MG/ML IJ SOLN
0.5000 mg | Freq: Once | INTRAMUSCULAR | Status: AC
  Administered 2016-03-16: 0.5 mg via INTRAVENOUS

## 2016-03-16 MED ORDER — DIGOXIN 0.25 MG/ML IJ SOLN
INTRAMUSCULAR | Status: AC
  Administered 2016-03-16: 0.5 mg via INTRAVENOUS
  Filled 2016-03-16: qty 2

## 2016-03-16 MED ORDER — AMIODARONE IV BOLUS ONLY 150 MG/100ML
INTRAVENOUS | Status: AC
  Filled 2016-03-16: qty 100

## 2016-03-16 MED ORDER — PANTOPRAZOLE SODIUM 40 MG IV SOLR
40.0000 mg | INTRAVENOUS | Status: DC
  Administered 2016-03-16 – 2016-03-17 (×2): 40 mg via INTRAVENOUS
  Filled 2016-03-16 (×2): qty 40

## 2016-03-16 MED ORDER — LORAZEPAM 2 MG/ML IJ SOLN
0.0000 mg | Freq: Two times a day (BID) | INTRAMUSCULAR | Status: DC
Start: 2016-03-18 — End: 2016-03-18

## 2016-03-16 MED ORDER — LORAZEPAM 2 MG/ML IJ SOLN
1.0000 mg | Freq: Four times a day (QID) | INTRAMUSCULAR | Status: DC | PRN
  Administered 2016-03-18: 1 mg via INTRAVENOUS
  Filled 2016-03-16 (×2): qty 1

## 2016-03-16 MED ORDER — SODIUM CHLORIDE 0.9 % IV SOLN: 250.0000 mL | INTRAVENOUS | Status: DC | PRN

## 2016-03-16 MED ORDER — NICOTINE 14 MG/24HR TD PT24
14.0000 mg | MEDICATED_PATCH | Freq: Every day | TRANSDERMAL | Status: DC
  Administered 2016-03-17 – 2016-03-18 (×3): 14 mg via TRANSDERMAL
  Filled 2016-03-16 (×3): qty 1

## 2016-03-16 MED ORDER — FOLIC ACID 1 MG PO TABS
1.0000 mg | ORAL_TABLET | Freq: Every day | ORAL | Status: DC
  Administered 2016-03-16 – 2016-03-18 (×3): 1 mg via ORAL
  Filled 2016-03-16 (×3): qty 1

## 2016-03-16 MED ORDER — SODIUM CHLORIDE 0.9% FLUSH
3.0000 mL | INTRAVENOUS | Status: DC | PRN
  Administered 2016-03-18: 3 mL via INTRAVENOUS
  Filled 2016-03-16: qty 3

## 2016-03-16 MED ORDER — LORAZEPAM 2 MG/ML IJ SOLN
1.0000 mg | Freq: Once | INTRAMUSCULAR | Status: AC
  Administered 2016-03-16: 1 mg via INTRAVENOUS

## 2016-03-16 MED ORDER — TIOTROPIUM BROMIDE MONOHYDRATE 18 MCG IN CAPS
18.0000 ug | ORAL_CAPSULE | Freq: Every day | RESPIRATORY_TRACT | Status: DC
  Administered 2016-03-17 – 2016-03-18 (×2): 18 ug via RESPIRATORY_TRACT
  Filled 2016-03-16: qty 5

## 2016-03-16 MED ORDER — ONDANSETRON HCL 4 MG PO TABS
4.0000 mg | ORAL_TABLET | Freq: Four times a day (QID) | ORAL | Status: DC | PRN
Start: 2016-03-16 — End: 2016-03-18

## 2016-03-16 MED ORDER — ACETAMINOPHEN 325 MG PO TABS
650.0000 mg | ORAL_TABLET | Freq: Four times a day (QID) | ORAL | Status: DC | PRN
  Administered 2016-03-17: 650 mg via ORAL
  Filled 2016-03-16: qty 2

## 2016-03-16 MED ORDER — IPRATROPIUM BROMIDE 0.02 % IN SOLN
0.5000 mg | Freq: Four times a day (QID) | RESPIRATORY_TRACT | Status: DC
  Administered 2016-03-17 – 2016-03-18 (×6): 0.5 mg via RESPIRATORY_TRACT
  Filled 2016-03-16 (×7): qty 2.5

## 2016-03-16 MED ORDER — DOPAMINE-DEXTROSE 3.2-5 MG/ML-% IV SOLN: 0.0000 ug/kg/min | INTRAVENOUS | Status: DC

## 2016-03-16 MED ORDER — ONDANSETRON HCL 4 MG/2ML IJ SOLN: 4.0000 mg | Freq: Four times a day (QID) | INTRAMUSCULAR | Status: DC | PRN

## 2016-03-16 MED ORDER — MAGNESIUM OXIDE 400 (241.3 MG) MG PO TABS
400.0000 mg | ORAL_TABLET | Freq: Two times a day (BID) | ORAL | Status: DC
  Administered 2016-03-16 – 2016-03-18 (×5): 400 mg via ORAL
  Filled 2016-03-16 (×5): qty 1

## 2016-03-16 MED ORDER — IOPAMIDOL (ISOVUE-300) INJECTION 61%
15.0000 mL | INTRAVENOUS | Status: AC
  Administered 2016-03-16: 30 mL via ORAL

## 2016-03-16 MED ORDER — VITAMIN B-1 100 MG PO TABS
100.0000 mg | ORAL_TABLET | Freq: Every day | ORAL | Status: DC
  Administered 2016-03-17: 100 mg via ORAL
  Filled 2016-03-16 (×2): qty 1

## 2016-03-16 MED ORDER — ADULT MULTIVITAMIN W/MINERALS CH
1.0000 | ORAL_TABLET | Freq: Every day | ORAL | Status: DC
  Administered 2016-03-16 – 2016-03-18 (×3): 1 via ORAL
  Filled 2016-03-16 (×3): qty 1

## 2016-03-16 NOTE — ED Notes (Signed)
Call from CT by Pam, with reports of pt having seizure like activity. Pt brought back to [facility]. Pt snoring upon arrival. Non-re-breather placed, MD made aware.

## 2016-03-16 NOTE — ED Notes (Signed)
Patient transported to CT 

## 2016-03-16 NOTE — H&P (Addendum)
[facility]   PATIENT NAME: Kirk DiesDavid Cortez    MR#:  [id number]  DATE OF BIRTH:  Aug 04, 1960  DATE OF ADMISSION:  03/15/2016  PRIMARY CARE PHYSICIAN: [facility]   REQUESTING/REFERRING PHYSICIAN:   CHIEF COMPLAINT:   Chief Complaint  Patient presents with  . Loss of Consciousness    HISTORY OF PRESENT ILLNESS: Kirk Cortez  is a 56 y.o. male with a known history of Atrial fibrillation, congestive heart failure, COPD, hypertension presented to the emergency room after [facility]. In the emergency room patient blood pressure was low and he was bolused with IV fluid. He was evaluated and his hemoglobin was around 7.5 and bedside lactate level was elevated. Patient was given 2 units of packed red blood cells intravenously in the emergency room. He was worked up with CT head and CT spine. On the way to CT scan for the head patient had a seizure. He was arousable to deep pressure and painful stimuli and responding to verbal commands . Patient says he passed out and does not remember the exact sequence of events .Still lethargic and drifts back to sleep. Chest x-ray did not reveal any pneumonia and urine analysis also did not reveal any infection. Hospitalist service was consulted for further care of the patient.  PAST MEDICAL HISTORY:   Past Medical History:  Diagnosis Date  . A-fib (HCC)   . CHF (congestive heart failure) (HCC)   . COPD (chronic obstructive pulmonary disease) (HCC)   . Heart attack   . Heart murmur   . Hypertension     PAST SURGICAL HISTORY: Past Surgical History:  Procedure Laterality Date  . APPENDECTOMY    . HERNIA REPAIR      SOCIAL HISTORY:  Social History  Substance Use Topics  . Smoking status: Current Every Day Smoker    Packs/day: 0.50    Years: 40.00  . Smokeless tobacco: Never Used  . Alcohol use No    FAMILY HISTORY:  Family  History  Problem Relation Age of Onset  . Cancer Mother   . Heart attack Father     DRUG ALLERGIES: No Known Allergies  REVIEW OF SYSTEMS:  Could not be obtained due to change in mental status   MEDICATIONS AT HOME:  Prior to Admission medications   Medication Sig Start Date End Date Taking? Authorizing Provider  albuterol (ACCUNEB) 1.25 MG/3ML nebulizer solution Take 1.25 mg by nebulization every 6 (six) hours as needed. 08/06/15 08/05/16 Yes Historical Provider, MD  aspirin EC 81 MG tablet Take 81 mg by mouth daily. 04/11/13  Yes Historical Provider, MD  atorvastatin (LIPITOR) 80 MG tablet Take 80 mg by mouth every evening. 08/06/15  Yes Historical Provider, MD  furosemide (LASIX) 80 MG tablet Take 80 mg by mouth 2 (two) times daily.   Yes Historical Provider, MD  ipratropium (ATROVENT HFA) 17 MCG/ACT inhaler Inhale 2 puffs into the lungs every 6 (six) hours.   Yes Historical Provider, MD  lisinopril (PRINIVIL,ZESTRIL) 2.5 MG tablet Take 2.5 mg by mouth daily.   Yes Historical Provider, MD  magnesium oxide (MAG-OX) 400 MG tablet Take 400 mg by mouth 2 (two) times daily. 07/02/15 07/01/16 Yes Historical Provider, MD  metoprolol succinate (TOPROL-XL) 25 MG 24 hr tablet Take 12.5 mg by mouth daily.   Yes Historical Provider, MD  pantoprazole (PROTONIX) 40 MG tablet Take 40 mg by mouth  2 (two) times daily.   Yes Historical Provider, MD  sertraline (ZOLOFT) 50 MG tablet Take 50 mg by mouth daily.   Yes Historical Provider, MD  tiotropium (SPIRIVA) 18 MCG inhalation capsule Place 18 mcg into inhaler and inhale daily.   Yes Historical Provider, MD  metolazone (ZAROXOLYN) 2.5 MG tablet Take 2.5 mg by mouth daily.    Historical Provider, MD      PHYSICAL EXAMINATION:   VITAL SIGNS: Blood pressure (!) 90/59, pulse (!) 103, temperature 98.1 F (36.7 C), temperature source Oral, resp. rate 17, height 5\' 10"  (1.778 m), weight 113.4 kg (250 lb), SpO2 96 %.  GENERAL:  56 y.o.-year-old patient lying in  the bed .  EYES: Pupils equal, round, reactive to light and accommodation. No scleral icterus. Extraocular muscles intact.  HEENT: Head atraumatic, normocephalic. Oropharynx dry and nasopharynx clear.  NECK:  Supple, no jugular venous distention. No thyroid enlargement, no tenderness.  LUNGS: Normal breath sounds bilaterally, no wheezing, rales,rhonchi or crepitation. No use of accessory muscles of respiration.  CARDIOVASCULAR: S1, S2 irregular. No murmurs, rubs, or gallops.  ABDOMEN: Soft, nontender, nondistended. Bowel sounds present. No organomegaly or mass.  EXTREMITIES: Has  pedal edema, no cyanosis, or clubbing.  NEUROLOGIC: Not oriented to time,place and person.Motor power 5/5 all extremities Moves all extremities.No cerebellar signs noted. PSYCHIATRIC: could not be assessed SKIN: No obvious rash, lesion, or ulcer.   LABORATORY PANEL:   CBC  Recent Labs Lab 03/15/16 2351 03/16/16 0422  WBC 9.6 9.1  HGB 7.3* 7.5*  HCT 24.2* 24.2*  PLT 468* 422  MCV 77.3* 77.2*  MCH 23.2* 23.9*  MCHC 30.0* 30.9*  RDW 22.0* 22.2*   ------------------------------------------------------------------------------------------------------------------  Chemistries   Recent Labs Lab 03/15/16 2351 03/16/16 0542  NA 134*  --   K 4.6  --   CL 98*  --   CO2 22  --   GLUCOSE 106*  --   BUN 86*  --   CREATININE 2.54*  --   CALCIUM 8.6*  --   AST  --  13*  ALT  --  19  ALKPHOS  --  81  BILITOT  --  0.9   ------------------------------------------------------------------------------------------------------------------ estimated creatinine clearance is 41 mL/min (by C-G formula based on SCr of 2.54 mg/dL (H)). ------------------------------------------------------------------------------------------------------------------ No results for input(s): TSH, T4TOTAL, T3FREE, THYROIDAB in the last 72 hours.  Invalid input(s): FREET3   Coagulation profile  Recent Labs Lab 03/16/16 0542  INR  1.55   ------------------------------------------------------------------------------------------------------------------- No results for input(s): DDIMER in the last 72 hours. -------------------------------------------------------------------------------------------------------------------  Cardiac Enzymes  Recent Labs Lab 03/15/16 2351  TROPONINI 0.04*   ------------------------------------------------------------------------------------------------------------------ Invalid input(s): POCBNP  ---------------------------------------------------------------------------------------------------------------  Urinalysis    Component Value Date/Time   COLORURINE YELLOW (A) 03/15/2016 2351   APPEARANCEUR CLOUDY (A) 03/15/2016 2351   LABSPEC 1.013 03/15/2016 2351   PHURINE 5.0 03/15/2016 2351   GLUCOSEU NEGATIVE 03/15/2016 2351   HGBUR NEGATIVE 03/15/2016 2351   BILIRUBINUR NEGATIVE 03/15/2016 2351   KETONESUR NEGATIVE 03/15/2016 2351   PROTEINUR 100 (A) 03/15/2016 2351   NITRITE NEGATIVE 03/15/2016 2351   LEUKOCYTESUR NEGATIVE 03/15/2016 2351     RADIOLOGY: Ct Head Wo Contrast  Result Date: 03/16/2016 CLINICAL DATA:  56 y/o  M; syncopal episode. EXAM: CT HEAD WITHOUT CONTRAST CT CERVICAL SPINE WITHOUT CONTRAST TECHNIQUE: Multidetector CT imaging of the head and cervical spine was performed following the standard protocol without intravenous contrast. Multiplanar CT image reconstructions of the cervical spine were also generated. CT of the  cervical spine was repeated due to extensive motion artifact. COMPARISON:  09/16/2015 CT head FINDINGS: CT HEAD FINDINGS Brain: No evidence of acute infarction, hemorrhage, hydrocephalus, extra-axial collection or mass lesion/mass effect. Mild stable chronic microvascular ischemic changes of white matter. Spur Vascular: Mild calcific atherosclerosis of cavernous internal carotid arteries. Skull: Scalp thickening in the left paramedian parietal region  probably representing contusion. No displaced calvarial fracture. Sinuses/Orbits: No acute finding. Other: None. CT CERVICAL SPINE FINDINGS Alignment: Straightening of cervical lordosis. C3-4 grade 1 anterolisthesis, likely degenerative. Skull base and vertebrae: No acute fracture. No primary bone lesion or focal pathologic process. Soft tissues and spinal canal: No prevertebral fluid or swelling. No visible canal hematoma. Disc levels: Cervical spondylosis with predominantly discogenic degenerative changes at the C5 through C7 levels as well as prominent facet arthrosis on the left from C3 through C5. There is a central disc protrusion at C2-3 and contact with the anterior cord and multifactorial canal stenosis greatest at the C6-7 level where it is at least moderate. Upper chest: Negative. Other: 7 mm right thyroid lobe nodule. Moderate calcific atherosclerosis of carotid bifurcations bilaterally. Soft tissue thickening of the posterior commissure of the glottis (series 17, image 59) not clearly present on the motion degraded first acquisition, probably related to phonation. IMPRESSION: 1. Left paramedian parietal scalp soft tissue thickening probably representing a small contusion. No displaced calvarial fracture. 2. No acute intracranial abnormality. 3. Stable mild chronic microvascular ischemic changes of the brain. 4. No acute fracture or dislocation of cervical spine. 5. Cervical spondylosis greatest at C6-7 with there is multifactorial at least moderate canal stenosis. 6. Moderate calcific atherosclerosis of carotid bifurcations bilaterally. Electronically Signed   By: Mitzi Hansen M.D.   On: 03/16/2016 01:27   Ct Cervical Spine Wo Contrast  Result Date: 03/16/2016 CLINICAL DATA:  55 y/o  M; syncopal episode. EXAM: CT HEAD WITHOUT CONTRAST CT CERVICAL SPINE WITHOUT CONTRAST TECHNIQUE: Multidetector CT imaging of the head and cervical spine was performed following the standard protocol without  intravenous contrast. Multiplanar CT image reconstructions of the cervical spine were also generated. CT of the cervical spine was repeated due to extensive motion artifact. COMPARISON:  09/16/2015 CT head FINDINGS: CT HEAD FINDINGS Brain: No evidence of acute infarction, hemorrhage, hydrocephalus, extra-axial collection or mass lesion/mass effect. Mild stable chronic microvascular ischemic changes of white matter. Spur Vascular: Mild calcific atherosclerosis of cavernous internal carotid arteries. Skull: Scalp thickening in the left paramedian parietal region probably representing contusion. No displaced calvarial fracture. Sinuses/Orbits: No acute finding. Other: None. CT CERVICAL SPINE FINDINGS Alignment: Straightening of cervical lordosis. C3-4 grade 1 anterolisthesis, likely degenerative. Skull base and vertebrae: No acute fracture. No primary bone lesion or focal pathologic process. Soft tissues and spinal canal: No prevertebral fluid or swelling. No visible canal hematoma. Disc levels: Cervical spondylosis with predominantly discogenic degenerative changes at the C5 through C7 levels as well as prominent facet arthrosis on the left from C3 through C5. There is a central disc protrusion at C2-3 and contact with the anterior cord and multifactorial canal stenosis greatest at the C6-7 level where it is at least moderate. Upper chest: Negative. Other: 7 mm right thyroid lobe nodule. Moderate calcific atherosclerosis of carotid bifurcations bilaterally. Soft tissue thickening of the posterior commissure of the glottis (series 17, image 59) not clearly present on the motion degraded first acquisition, probably related to phonation. IMPRESSION: 1. Left paramedian parietal scalp soft tissue thickening probably representing a small contusion. No displaced calvarial fracture. 2. No  acute intracranial abnormality. 3. Stable mild chronic microvascular ischemic changes of the brain. 4. No acute fracture or dislocation of  cervical spine. 5. Cervical spondylosis greatest at C6-7 with there is multifactorial at least moderate canal stenosis. 6. Moderate calcific atherosclerosis of carotid bifurcations bilaterally. Electronically Signed   By: Mitzi Hansen M.D.   On: 03/16/2016 01:27   Dg Chest Portable 1 View  Result Date: 03/16/2016 CLINICAL DATA:  Syncopal episode tonight.  Lethargic. EXAM: PORTABLE CHEST 1 VIEW COMPARISON:  02/13/2016 FINDINGS: Cardiac enlargement without vascular congestion. No edema or consolidation. No blunting of costophrenic angles. No pneumothorax. Calcification of the aorta. IMPRESSION: Cardiac enlargement.  No evidence of active pulmonary disease. Electronically Signed   By: Burman Nieves M.D.   On: 03/16/2016 00:43    EKG: Orders placed or performed during the hospital encounter of 03/15/16  . ED EKG  . ED EKG  . EKG 12-Lead  . EKG 12-Lead    IMPRESSION AND PLAN: 56 year old male patient with history of atrial fibrillation, congestive heart failure, COPD, hypertension presented to the emergency room after passing out at [place] out [facility]. Admitting diagnosis 1. Syncope 2. Hypotension 3. New onset seizure 4. Symptomatic anemia 5. Chronic kidney disease 6. Atrial fibrillation 7. History of congestive heart failure Treatment plan Admit patient to stepdown unit Patient already received 2 L of IV fluid bolus Patient received 2 units of packed red blood cells intravenously Judicious use of fluids Neurology consultation Alcohol withdrawal protocol Monitor hemoglobin and hematocrit Hold hypertensive medication and diuretics Cycle troponin to rule out ischemia Check echocardiogram   All the records are reviewed and case discussed with ED provider. Management plans discussed with the patient, family and they are in agreement.  CODE STATUS:FULL CODE Code Status History    Date Active Date Inactive Code Status Order ID Comments User Context   09/16/2015  9:28 PM  09/19/2015 11:59 AM Full Code [id number]  Houston Siren, MD Inpatient   09/15/2015  5:51 AM 09/15/2015 12:06 PM Full Code [id number]  Ihor Austin, MD Inpatient       TOTAL CRITICAL CARE TIME TAKING CARE OF THIS PATIENT: 52 minutes.    Ihor Austin M.D on 03/16/2016 at 6:19 AM  Between 7am to 6pm - Pager - [phone]  After 6pm go to [email or web address] - password [email or web address]  [facility]  Office  [phone]  CC: Primary care physician; [facility]

## 2016-03-16 NOTE — Progress Notes (Addendum)
[facility] [place] at [facility]   PATIENT NAME: Kirk Cortez    MR#:  [id number]  DATE OF BIRTH:  08-28-1960  SUBJECTIVE:  CHIEF COMPLAINT:   Chief Complaint  Patient presents with  . Loss of Consciousness  The patient is 56 year old Caucasian male with past medical history significant for history of pulmonary hypertension, coronary artery disease, cardiomyopathy with ejection fraction of 20-25% , hypertension, atrial fibrillation, pulmonary embolism, gastrointestinal bleed, polysubstance abuse, who presents to the hospital with complaints of syncopal episode. He was noted to have low blood pressure and his hemoglobin level was found to be low, lactate level was also found to be increased. Patient was transfused packed red blood cells, and admitted. While during radiologic evaluation, patient had seizure episode. He remains in emergency room and remained hypotensive. Denies any chest pains, overall, however, admits of some shortness of breath. Since to leave the hospital AGAINST MEDICAL ADVICE  Review of Systems  Unable to perform ROS: Medical condition    VITAL SIGNS: Blood pressure 109/89, pulse (!) 108, temperature 98.1 F (36.7 C), temperature source Oral, resp. rate (!) 21, height 5\' 10"  (1.778 m), weight 113.4 kg (250 lb), SpO2 93 %.  PHYSICAL EXAMINATION:   GENERAL:  56 y.o.-year-old patient lying in the bed with no acute distress, Somewhat dyspneic, slurring speech.  EYES: Pupils equal, round, reactive to light and accommodation. No scleral icterus. Extraocular muscles intact.  HEENT: Head atraumatic, normocephalic. Oropharynx and nasopharynx clear.  NECK:  Supple, no jugular venous distention. No thyroid enlargement, no tenderness.  LUNGS: Normal breath sounds bilaterally, no wheezing, , scattered rales,rhonchi, but no crepitations.intermittent use  of accessory muscles of respiration, Especially with speech or moving around.  CARDIOVASCULAR: S1,  S2tachycardic, irregularly irregular.3-6 systolic murmur was heard with some radiation to the left axilla, no rubs, or gallops.  ABDOMEN: Soft, nontender, nondistended. Bowel sounds present. No organomegaly or mass.  EXTREMITIES:2+ lower extremity and pedal  edema,no  cyanosis, or clubbing.  NEUROLOGIC: Cranial nerves II through XII are intact. Muscle strength 5/5 in all extremities. Sensation intact. Gait not checked.  PSYCHIATRIC: The patient is alert and oriented x 3. Slurring speech, difficult to understand  SKIN: No obvious rash, lesion, or ulcer.   ORDERS/RESULTS REVIEWED:   CBC  Recent Labs Lab 03/15/16 2351 03/16/16 0422 03/16/16 1247  WBC 9.6 9.1  --   HGB 7.3* 7.5* 9.1*  HCT 24.2* 24.2* 29.5*  PLT 468* 422  --   MCV 77.3* 77.2*  --   MCH 23.2* 23.9*  --   MCHC 30.0* 30.9*  --   RDW 22.0* 22.2*  --    ------------------------------------------------------------------------------------------------------------------  Chemistries   Recent Labs Lab 03/15/16 2351 03/16/16 0542  NA 134* 133*  K 4.6 4.6  CL 98* 101  CO2 22 23  GLUCOSE 106* 108*  BUN 86* 80*  CREATININE 2.54* 2.28*  CALCIUM 8.6* 8.2*  AST  --  13*  ALT  --  19  ALKPHOS  --  81  BILITOT  --  0.9   ------------------------------------------------------------------------------------------------------------------ estimated creatinine clearance is 45.6 mL/min (by C-G formula based on SCr of 2.28 mg/dL (H)). ------------------------------------------------------------------------------------------------------------------ No results for input(s): TSH, T4TOTAL, T3FREE, THYROIDAB in the last 72 hours.  Invalid input(s): FREET3  Cardiac Enzymes  Recent Labs Lab 03/15/16 2351 03/16/16 0542 03/16/16 1247  TROPONINI 0.04* <0.03 <0.03   ------------------------------------------------------------------------------------------------------------------ Invalid input(s):  POCBNP ---------------------------------------------------------------------------------------------------------------  RADIOLOGY: Ct Head Wo Contrast  Result Date: 03/16/2016 CLINICAL DATA:  56  y/o  M; syncopal episode. EXAM: CT HEAD WITHOUT CONTRAST CT CERVICAL SPINE WITHOUT CONTRAST TECHNIQUE: Multidetector CT imaging of the head and cervical spine was performed following the standard protocol without intravenous contrast. Multiplanar CT image reconstructions of the cervical spine were also generated. CT of the cervical spine was repeated due to extensive motion artifact. COMPARISON:  09/16/2015 CT head FINDINGS: CT HEAD FINDINGS Brain: No evidence of acute infarction, hemorrhage, hydrocephalus, extra-axial collection or mass lesion/mass effect. Mild stable chronic microvascular ischemic changes of white matter. Spur Vascular: Mild calcific atherosclerosis of cavernous internal carotid arteries. Skull: Scalp thickening in the left paramedian parietal region probably representing contusion. No displaced calvarial fracture. Sinuses/Orbits: No acute finding. Other: None. CT CERVICAL SPINE FINDINGS Alignment: Straightening of cervical lordosis. C3-4 grade 1 anterolisthesis, likely degenerative. Skull base and vertebrae: No acute fracture. No primary bone lesion or focal pathologic process. Soft tissues and spinal canal: No prevertebral fluid or swelling. No visible canal hematoma. Disc levels: Cervical spondylosis with predominantly discogenic degenerative changes at the C5 through C7 levels as well as prominent facet arthrosis on the left from C3 through C5. There is a central disc protrusion at C2-3 and contact with the anterior cord and multifactorial canal stenosis greatest at the C6-7 level where it is at least moderate. Upper chest: Negative. Other: 7 mm right thyroid lobe nodule. Moderate calcific atherosclerosis of carotid bifurcations bilaterally. Soft tissue thickening of the posterior commissure of the  glottis (series 17, image 59) not clearly present on the motion degraded first acquisition, probably related to phonation. IMPRESSION: 1. Left paramedian parietal scalp soft tissue thickening probably representing a small contusion. No displaced calvarial fracture. 2. No acute intracranial abnormality. 3. Stable mild chronic microvascular ischemic changes of the brain. 4. No acute fracture or dislocation of cervical spine. 5. Cervical spondylosis greatest at C6-7 with there is multifactorial at least moderate canal stenosis. 6. Moderate calcific atherosclerosis of carotid bifurcations bilaterally. Electronically Signed   By: Mitzi HansenLance  Furusawa-Stratton M.D.   On: 03/16/2016 01:27   Ct Cervical Spine Wo Contrast  Result Date: 03/16/2016 CLINICAL DATA:  56 y/o  M; syncopal episode. EXAM: CT HEAD WITHOUT CONTRAST CT CERVICAL SPINE WITHOUT CONTRAST TECHNIQUE: Multidetector CT imaging of the head and cervical spine was performed following the standard protocol without intravenous contrast. Multiplanar CT image reconstructions of the cervical spine were also generated. CT of the cervical spine was repeated due to extensive motion artifact. COMPARISON:  09/16/2015 CT head FINDINGS: CT HEAD FINDINGS Brain: No evidence of acute infarction, hemorrhage, hydrocephalus, extra-axial collection or mass lesion/mass effect. Mild stable chronic microvascular ischemic changes of white matter. Spur Vascular: Mild calcific atherosclerosis of cavernous internal carotid arteries. Skull: Scalp thickening in the left paramedian parietal region probably representing contusion. No displaced calvarial fracture. Sinuses/Orbits: No acute finding. Other: None. CT CERVICAL SPINE FINDINGS Alignment: Straightening of cervical lordosis. C3-4 grade 1 anterolisthesis, likely degenerative. Skull base and vertebrae: No acute fracture. No primary bone lesion or focal pathologic process. Soft tissues and spinal canal: No prevertebral fluid or swelling. No  visible canal hematoma. Disc levels: Cervical spondylosis with predominantly discogenic degenerative changes at the C5 through C7 levels as well as prominent facet arthrosis on the left from C3 through C5. There is a central disc protrusion at C2-3 and contact with the anterior cord and multifactorial canal stenosis greatest at the C6-7 level where it is at least moderate. Upper chest: Negative. Other: 7 mm right thyroid lobe nodule. Moderate calcific atherosclerosis of carotid bifurcations  bilaterally. Soft tissue thickening of the posterior commissure of the glottis (series 17, image 59) not clearly present on the motion degraded first acquisition, probably related to phonation. IMPRESSION: 1. Left paramedian parietal scalp soft tissue thickening probably representing a small contusion. No displaced calvarial fracture. 2. No acute intracranial abnormality. 3. Stable mild chronic microvascular ischemic changes of the brain. 4. No acute fracture or dislocation of cervical spine. 5. Cervical spondylosis greatest at C6-7 with there is multifactorial at least moderate canal stenosis. 6. Moderate calcific atherosclerosis of carotid bifurcations bilaterally. Electronically Signed   By: Mitzi Hansen M.D.   On: 03/16/2016 01:27   Dg Chest Portable 1 View  Result Date: 03/16/2016 CLINICAL DATA:  Syncopal episode tonight.  Lethargic. EXAM: PORTABLE CHEST 1 VIEW COMPARISON:  02/13/2016 FINDINGS: Cardiac enlargement without vascular congestion. No edema or consolidation. No blunting of costophrenic angles. No pneumothorax. Calcification of the aorta. IMPRESSION: Cardiac enlargement.  No evidence of active pulmonary disease. Electronically Signed   By: Burman Nieves M.D.   On: 03/16/2016 00:43    EKG:  Orders placed or performed during the hospital encounter of 03/15/16  . ED EKG  . ED EKG  . EKG 12-Lead  . EKG 12-Lead    ASSESSMENT AND PLAN:  Principal Problem:   Hypotension  #1. Syncopal  episode, likely hypotension related, most recent echocardiogram done at [facility] was January 2018, revealing ejection fraction of 20-25%, severe mitral regurgitation, moderate to severe dilated left atrium, aortic sclerosis, reduced right ventricular systolic function, severely elevated pulmonary systolic pressure. Get carotid ultrasound when patient is stable. Admit patient to critical care unit for impending right ventricular failure, get cardiologist involved for recommendations #2. Seizure episode, neurology consultation is requested, possibly related to hypotension/hypoxia, seizure precautions, Ativan as needed #3. Right-sided heart failure, impending right ventricular shock, admit patient to intensive care unit, initiate dopamine infusion. May need to transfer to tertiary care center for close follow-up and treatment #4. Acute on chronic renal failure, some improved, follow closely #5. Elevated troponin, likely demand ischemia, repeated troponin was normal, cardiac consultation is requested #6. Severe anemia, status post transfusion, hemoglobin level has improved #7. Hyponatremia in fluid overloaded Patient, follow closely, may improve with diuresis #8 A. fib atrial RVR, initiate amiodarone, cardiogenic consultation is pending, nontender. No anticoagulation due to anemia and concerns of bleeding  Management plans discussed with the patient, family and they are in agreement.   DRUG ALLERGIES: No Known Allergies  CODE STATUS:  Code Status History    Date Active Date Inactive Code Status Order ID Comments User Context   09/16/2015  9:28 PM 09/19/2015 11:59 AM Full Code [id number]  Houston Siren, MD Inpatient   09/15/2015  5:51 AM 09/15/2015 12:06 PM Full Code [id number]  Ihor Austin, MD Inpatient      TOTAL Critical care TIME TAKING CARE OF THIS PATIENT: 45  minutes.  Discussed with cardiology  Shams Fill M.D on 03/16/2016 at 3:23 PM  Between 7am to 6pm - Pager - [phone]  After 6pm go to  [email or web address] - password [email or web address] [facility]  [facility]  Office  [phone]  CC: Primary care physician; [facility]

## 2016-03-16 NOTE — ED Provider Notes (Signed)
[facility] Emergency Department Provider Note   ____________________________________________   First MD Initiated Contact with Patient 03/15/16 2357     (approximate)  I have reviewed the triage vital signs and the nursing notes.   HISTORY  Chief Complaint Loss of Consciousness    HPI Kirk Cortez is a 56 y.o. male who comes into the hospital today after passing out at cookout. The patient reports that he has been dizzy all day. He states that he missed his appointment at the cardiologist because he didn't feel that he was safe enough to drive there. He states he's been dizzy for a couple of days with the room spinning. He reports that he's had pneumonia on and off for 6 months and he has a diagnosis recently of CHF and A. fib. The patient reports that he is taking fluid pills, listened PERRL and blood thinners. He reports that he has not had any real chest pain and he's had a low-grade temperature. He recently finished his antibiotics. The patient has had some nausea with no vomiting, diarrhea, abdominal pain. He reports that he has some blurred vision when he stands. He does drink and states he only had 2 drinks today. The patient is here today for evaluation of his syncope.   Past Medical History:  Diagnosis Date  . A-fib (HCC)   . CHF (congestive heart failure) (HCC)   . COPD (chronic obstructive pulmonary disease) (HCC)   . Heart attack   . Heart murmur   . Hypertension     Patient Active Problem List   Diagnosis Date Noted  . Hypotension 03/16/2016  . Opiate dependence (HCC) 09/17/2015  . 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 Acute respiratory failure with hypoxia (HCC) 09/16/2015  . Community acquired pneumonia 09/15/2015  . CAP (community acquired pneumonia) 09/15/2015  . CHF (congestive heart failure) (HCC) 08/16/2015    Past Surgical History:  Procedure Laterality Date  . APPENDECTOMY    . HERNIA REPAIR      Prior to Admission  medications   Medication Sig Start Date End Date Taking? Authorizing Provider  albuterol (ACCUNEB) 1.25 MG/3ML nebulizer solution Take 1.25 mg by nebulization every 6 (six) hours as needed. 08/06/15 08/05/16 Yes Historical Provider, MD  aspirin EC 81 MG tablet Take 81 mg by mouth daily. 04/11/13  Yes Historical Provider, MD  atorvastatin (LIPITOR) 80 MG tablet Take 80 mg by mouth every evening. 08/06/15  Yes Historical Provider, MD  furosemide (LASIX) 80 MG tablet Take 80 mg by mouth 2 (two) times daily.   Yes Historical Provider, MD  ipratropium (ATROVENT HFA) 17 MCG/ACT inhaler Inhale 2 puffs into the lungs every 6 (six) hours.   Yes Historical Provider, MD  lisinopril (PRINIVIL,ZESTRIL) 2.5 MG tablet Take 2.5 mg by mouth daily.   Yes Historical Provider, MD  magnesium oxide (MAG-OX) 400 MG tablet Take 400 mg by mouth 2 (two) times daily. 07/02/15 07/01/16 Yes Historical Provider, MD  metoprolol succinate (TOPROL-XL) 25 MG 24 hr tablet Take 12.5 mg by mouth daily.   Yes Historical Provider, MD  pantoprazole (PROTONIX) 40 MG tablet Take 40 mg by mouth 2 (two) times daily.   Yes Historical Provider, MD  sertraline (ZOLOFT) 50 MG tablet Take 50 mg by mouth daily.   Yes Historical Provider, MD  tiotropium (SPIRIVA) 18 MCG inhalation capsule Place 18 mcg into inhaler and inhale daily.   Yes Historical Provider, MD  metolazone (ZAROXOLYN) 2.5 MG tablet Take 2.5 mg by mouth daily.  Historical Provider, MD    Allergies Patient has no known allergies.  Family History  Problem Relation Age of Onset  . Cancer Mother   . Heart attack Father     Social History Social History  Substance Use Topics  . Smoking status: Current Every Day Smoker    Packs/day: 0.50    Years: 40.00  . Smokeless tobacco: Never Used  . Alcohol use No    Review of Systems Constitutional: No fever/chills Eyes: No visual changes. ENT: No sore throat. Cardiovascular: Denies chest pain. Respiratory: Denies shortness of  breath. Gastrointestinal: Nausea No abdominal pain. no vomiting.  No diarrhea.  No constipation. Genitourinary: Negative for dysuria. Musculoskeletal: Negative for back pain. Skin: Negative for rash. Neurological: dizziness, syncope  10-point ROS otherwise negative.  ____________________________________________   PHYSICAL EXAM:  VITAL SIGNS: ED Triage Vitals  Enc Vitals Group     BP 03/16/16 0000 (!) 76/61     Pulse Rate 03/15/16 2352 (!) 113     Resp 03/15/16 2352 18     Temp 03/15/16 2352 97.7 F (36.5 C)     Temp Source 03/15/16 2352 Oral     SpO2 03/15/16 2351 90 %     Weight 03/15/16 2353 250 lb (113.4 kg)     Height 03/15/16 2353 5\' 10"  (1.778 m)     Head Circumference --      Peak Flow --      Pain Score --      Pain Loc --      Pain Edu? --      Excl. in GC? --     Constitutional: Alert and oriented. Well appearing and in moderate distress. Eyes: Conjunctivae are normal. PERRL. EOMI. Head: Atraumatic. Nose: No congestion/rhinnorhea. Mouth/Throat: Mucous membranes are moist.  Oropharynx non-erythematous. Cardiovascular: Tachycardia, irregular rhythm. Grossly normal heart sounds.  Good peripheral circulation. Respiratory: Normal respiratory effort.  No retractions. Coarse expiratory breath sounds throughout all lung fields Gastrointestinal: Soft and nontender. No distention. Positive bowel sounds Musculoskeletal: No lower extremity tenderness nor edema.  No joint effusions. Neurologic:  Normal speech and language. Cranial nerves II through XII are grossly intact with no focal motor or neuro deficits Skin:  Skin is warm, dry skin tears to bilateral forearms Psychiatric: Mood and affect are normal.   ____________________________________________   LABS (all labs ordered are listed, but only abnormal results are displayed)  Labs Reviewed  BASIC METABOLIC PANEL - Abnormal; Notable for the following:       Result Value   Sodium 134 (*)    Chloride 98 (*)     Glucose, Bld 106 (*)    BUN 86 (*)    Creatinine, Ser 2.54 (*)    Calcium 8.6 (*)    GFR calc non Af Amer 27 (*)    GFR calc Af Amer 31 (*)    All other components within normal limits  CBC - Abnormal; Notable for the following:    RBC 3.14 (*)    Hemoglobin 7.3 (*)    HCT 24.2 (*)    MCV 77.3 (*)    MCH 23.2 (*)    MCHC 30.0 (*)    RDW 22.0 (*)    Platelets 468 (*)    All other components within normal limits  URINALYSIS, COMPLETE (UACMP) WITH MICROSCOPIC - Abnormal; Notable for the following:    Color, Urine YELLOW (*)    APPearance CLOUDY (*)    Protein, ur 100 (*)    Bacteria, UA RARE (*)  Squamous Epithelial / LPF 0-5 (*)    All other components within normal limits  URINE DRUG SCREEN, QUALITATIVE ([facility] ONLY) - Abnormal; Notable for the following:    Tricyclic, Ur Screen POSITIVE (*)    Cocaine Metabolite,Ur [place] POSITIVE (*)    Opiate, Ur Screen POSITIVE (*)    Benzodiazepine, Ur Scrn POSITIVE (*)    All other components within normal limits  LACTIC ACID, PLASMA - Abnormal; Notable for the following:    Lactic Acid, Venous 2.3 (*)    All other components within normal limits  TROPONIN I - Abnormal; Notable for the following:    Troponin I 0.04 (*)    All other components within normal limits  BRAIN NATRIURETIC PEPTIDE - Abnormal; Notable for the following:    B Natriuretic Peptide 1,011.0 (*)    All other components within normal limits  ETHANOL - Abnormal; Notable for the following:    Alcohol, Ethyl (B) 33 (*)    All other components within normal limits  CBC - Abnormal; Notable for the following:    RBC 3.13 (*)    Hemoglobin 7.5 (*)    HCT 24.2 (*)    MCV 77.2 (*)    MCH 23.9 (*)    MCHC 30.9 (*)    RDW 22.2 (*)    All other components within normal limits  PROTIME-INR - Abnormal; Notable for the following:    Prothrombin Time 18.7 (*)    All other components within normal limits  HEPATIC FUNCTION PANEL - Abnormal; Notable for the following:    Albumin  3.1 (*)    AST 13 (*)    All other components within normal limits  LACTIC ACID, PLASMA  HEMOGLOBIN AND HEMATOCRIT, BLOOD  HEMOGLOBIN AND HEMATOCRIT, BLOOD  CBG MONITORING, ED  TYPE AND SCREEN  PREPARE RBC (CROSSMATCH)  ABO/RH   ____________________________________________  EKG  ED ECG REPORT I, Rebecka ApleyWebster,  Malori Myers P, the attending physician, personally viewed and interpreted this ECG.   Date: 03/16/2016  EKG Time: 2351  Rate: 113  Rhythm: atrial fibrillation, rate 113  Axis: normal  Intervals:none  ST&T Change: Flipped T waves in leads V5 and V6  ____________________________________________  RADIOLOGY  CT head and cervical spine CXR ____________________________________________   PROCEDURES  Procedure(s) performed: None  Procedures  Critical Care performed: Yes, see critical care note(s)  CRITICAL CARE Performed by: Lucrezia EuropeWebster,  Roan Miklos P   Total critical care time: 90 minutes  Critical care time was exclusive of separately billable procedures and treating other patients.  Critical care was necessary to treat or prevent imminent or life-threatening deterioration.  Critical care was time spent personally by me on the following activities: development of treatment plan with patient and/or surrogate as well as nursing, discussions with consultants, evaluation of patient's response to treatment, examination of patient, obtaining history from patient or surrogate, ordering and performing treatments and interventions, ordering and review of laboratory studies, ordering and review of radiographic studies, pulse oximetry and re-evaluation of patient's condition.  ____________________________________________   INITIAL IMPRESSION / ASSESSMENT AND PLAN / ED COURSE  Pertinent labs & imaging results that were available during my care of the patient were reviewed by me and considered in my medical decision making (see chart for details).  This is a 56 year old male who  comes into the hospital today after a syncopal episode. The patient arrived to the hospital with a blood pressure in the 80s systolic. I did start a liter of normal saline on the patient although  he does have a history of COPD. I did start to receive results and found that the patient's hemoglobin was 7.3. The concern was that maybe he had a GI bleed. The patient reports that he does drink and he couldn't have alcoholic gastritis. The patient is also on blood thinners for atrial fibrillation. I ordered a type and screen and started the process to transfuse the patient a unit of blood. I did check a rectal exam and the patient was guaiac negative. After approximately 2 L of normal saline and a unit of blood the patient blood pressure was in the low 90s to high 100s. His heart rate had improved although he still had moments where his heart rate went up into the 110s and 120s. Given the patient's blood pressure I did give the patient some digoxin to help slow down his heart rate. When the patient initially arrived he was stating that he would not stay in the hospital but after his ride stated they would not come and pick him up the patient was more amenable to staying in the hospital. He did have a seizure on his way to CT scan he received a dose of Ativan 1 mg IV. I did again receive blood work results showing that the patient's kidney function was worse than normal and that the patient had positive cocaine and opiates in his urinalysis. The patient's mental status was appropriate the entire time he was in the emergency department and he never had a fever. The patient has no source of a possible sepsis. He also does drink often and I feel that he may be going through some withdrawals which may be the cause of the seizure. After the initial unit of blood we did repeat the patient's hemoglobin and hematocrit and it was only 7.5. The patient had another unit of blood ordered. I contacted the admitting physician and they  will admit the patient to their service and the stepdown unit.  Clinical Course as of Mar 17 643  Wed Mar 16, 2016  1610 Cardiac enlargement.  No evidence of active pulmonary disease. DG Chest Portable 1 View [AW]  0138 1. Left paramedian parietal scalp soft tissue thickening probably representing a small contusion. No displaced calvarial fracture. 2. No acute intracranial abnormality. 3. Stable mild chronic microvascular ischemic changes of the brain. 4. No acute fracture or dislocation of cervical spine. 5. Cervical spondylosis greatest at C6-7 with there is multifactorial at least moderate canal stenosis. 6. Moderate calcific atherosclerosis of carotid bifurcations bilaterally.   CT Head Wo Contrast [AW]    Clinical Course User Index [AW] Rebecka Apley, MD     ____________________________________________   FINAL CLINICAL IMPRESSION(S) / ED DIAGNOSES  Final diagnoses:  Hypotension, unspecified hypotension type  Atrial fibrillation with rapid ventricular response (HCC)  Substance abuse  Syncope, unspecified syncope type  Symptomatic anemia  Seizure (HCC)      NEW MEDICATIONS STARTED DURING THIS VISIT:  New Prescriptions   No medications on file     Note:  This document was prepared using Dragon voice recognition software and may include unintentional dictation errors.    Rebecka Apley, MD 03/16/16 [phone]

## 2016-03-17 ENCOUNTER — Encounter: Payer: Self-pay | Admitting: Nurse Practitioner

## 2016-03-17 ENCOUNTER — Inpatient Hospital Stay: Payer: Medicaid Other

## 2016-03-17 ENCOUNTER — Inpatient Hospital Stay (HOSPITAL_COMMUNITY)
Admit: 2016-03-17 | Discharge: 2016-03-17 | Disposition: A | Discharge status: Home / Self Care | Payer: Medicaid Other | Attending: Internal Medicine | Admitting: Internal Medicine

## 2016-03-17 DIAGNOSIS — K921 Melena: Secondary | ICD-10-CM

## 2016-03-17 DIAGNOSIS — I4891 Unspecified atrial fibrillation: Secondary | ICD-10-CM

## 2016-03-17 DIAGNOSIS — I959 Hypotension, unspecified: Principal | ICD-10-CM

## 2016-03-17 DIAGNOSIS — F1910 Other psychoactive substance abuse, uncomplicated: Secondary | ICD-10-CM

## 2016-03-17 DIAGNOSIS — I420 Dilated cardiomyopathy: Secondary | ICD-10-CM

## 2016-03-17 DIAGNOSIS — I509 Heart failure, unspecified: Secondary | ICD-10-CM

## 2016-03-17 DIAGNOSIS — I951 Orthostatic hypotension: Secondary | ICD-10-CM

## 2016-03-17 DIAGNOSIS — R55 Syncope and collapse: Secondary | ICD-10-CM

## 2016-03-17 DIAGNOSIS — D649 Anemia, unspecified: Secondary | ICD-10-CM

## 2016-03-17 DIAGNOSIS — R569 Unspecified convulsions: Secondary | ICD-10-CM

## 2016-03-17 DIAGNOSIS — D62 Acute posthemorrhagic anemia: Secondary | ICD-10-CM

## 2016-03-17 LAB — CBC
HCT: 27.9 % — ABNORMAL LOW (ref 40.0–52.0)
HEMOGLOBIN: 8.9 g/dL — AB (ref 13.0–18.0)
MCH: 24.4 pg — AB (ref 26.0–34.0)
MCHC: 31.8 g/dL — ABNORMAL LOW (ref 32.0–36.0)
MCV: 76.9 fL — AB (ref 80.0–100.0)
PLATELETS: 411 10*3/uL (ref 150–440)
RBC: 3.63 MIL/uL — ABNORMAL LOW (ref 4.40–5.90)
RDW: 22.2 % — ABNORMAL HIGH (ref 11.5–14.5)
WBC: 6.2 10*3/uL (ref 3.8–10.6)

## 2016-03-17 LAB — COMPREHENSIVE METABOLIC PANEL
ALBUMIN: 3.1 g/dL — AB (ref 3.5–5.0)
ALK PHOS: 92 U/L (ref 38–126)
ALT: 28 U/L (ref 17–63)
ANION GAP: 7 (ref 5–15)
AST: 18 U/L (ref 15–41)
BILIRUBIN TOTAL: 1.1 mg/dL (ref 0.3–1.2)
BUN: 60 mg/dL — ABNORMAL HIGH (ref 6–20)
CALCIUM: 8.9 mg/dL (ref 8.9–10.3)
CO2: 26 mmol/L (ref 22–32)
CREATININE: 1.21 mg/dL (ref 0.61–1.24)
Chloride: 101 mmol/L (ref 101–111)
GFR calc Af Amer: >60 mL/min (ref >60)
GFR calc non Af Amer: >60 mL/min (ref >60)
GLUCOSE: 98 mg/dL (ref 65–99)
Potassium: 4.1 mmol/L (ref 3.5–5.1)
SODIUM: 134 mmol/L — AB (ref 135–145)
TOTAL PROTEIN: 7.2 g/dL (ref 6.5–8.1)

## 2016-03-17 LAB — EXPECTORATED SPUTUM ASSESSMENT W REFEX TO RESP CULTURE

## 2016-03-17 LAB — TYPE AND SCREEN
BLOOD PRODUCT EXPIRATION DATE: 201803062359
Blood Product Expiration Date: 201802282359
ISSUE DATE / TIME: 201802070214
ISSUE DATE / TIME: 201802070531
UNIT TYPE AND RH: 6200
Unit Type and Rh: 6200

## 2016-03-17 LAB — EXPECTORATED SPUTUM ASSESSMENT W GRAM STAIN, RFLX TO RESP C

## 2016-03-17 LAB — GLUCOSE, CAPILLARY
GLUCOSE-CAPILLARY: 110 mg/dL — AB (ref 65–99)
Glucose-Capillary: 124 mg/dL — ABNORMAL HIGH (ref 65–99)

## 2016-03-17 LAB — ECHOCARDIOGRAM COMPLETE
Height: 70 in
Weight: 3590.85 oz

## 2016-03-17 LAB — TROPONIN I
TROPONIN I: 0.03 ng/mL — AB (ref <0.03)
Troponin I: <0.03 ng/mL (ref <0.03)

## 2016-03-17 LAB — TSH: TSH: 2.019 u[IU]/mL (ref 0.350–4.500)

## 2016-03-17 MED ORDER — BUTALBITAL-APAP-CAFFEINE 50-325-40 MG PO TABS
1.0000 | ORAL_TABLET | Freq: Four times a day (QID) | ORAL | Status: DC | PRN
  Administered 2016-03-17: 1 via ORAL
  Filled 2016-03-17 (×2): qty 1

## 2016-03-17 MED ORDER — DEXTROSE 5 % IV SOLN: 1.0000 g | INTRAVENOUS | Status: DC

## 2016-03-17 MED ORDER — FUROSEMIDE 10 MG/ML IJ SOLN
40.0000 mg | Freq: Two times a day (BID) | INTRAMUSCULAR | Status: DC
  Administered 2016-03-17 – 2016-03-18 (×4): 40 mg via INTRAVENOUS
  Filled 2016-03-17 (×4): qty 4

## 2016-03-17 MED ORDER — PANTOPRAZOLE SODIUM 40 MG IV SOLR
40.0000 mg | Freq: Two times a day (BID) | INTRAVENOUS | Status: DC
  Administered 2016-03-17 – 2016-03-18 (×3): 40 mg via INTRAVENOUS
  Filled 2016-03-17 (×3): qty 40

## 2016-03-17 MED ORDER — AZITHROMYCIN 500 MG IV SOLR
500.0000 mg | INTRAVENOUS | Status: DC
  Administered 2016-03-17: 500 mg via INTRAVENOUS
  Filled 2016-03-17: qty 500

## 2016-03-17 MED ORDER — METOPROLOL SUCCINATE ER 25 MG PO TB24
12.5000 mg | ORAL_TABLET | Freq: Every day | ORAL | Status: DC
  Administered 2016-03-17 – 2016-03-18 (×2): 12.5 mg via ORAL
  Filled 2016-03-17 (×2): qty 1

## 2016-03-17 MED ORDER — DOXYCYCLINE HYCLATE 100 MG PO TABS
100.0000 mg | ORAL_TABLET | Freq: Two times a day (BID) | ORAL | Status: DC
  Administered 2016-03-18 (×2): 100 mg via ORAL
  Filled 2016-03-17 (×2): qty 1

## 2016-03-17 MED ORDER — CEFTRIAXONE SODIUM-DEXTROSE 1-3.74 GM-% IV SOLR
1.0000 g | INTRAVENOUS | Status: DC
  Administered 2016-03-17 – 2016-03-18 (×2): 1 g via INTRAVENOUS
  Filled 2016-03-17 (×3): qty 50

## 2016-03-17 MED ORDER — DEXTROSE 5 % IV SOLN: 500.0000 mg | INTRAVENOUS | Status: DC

## 2016-03-17 NOTE — Progress Notes (Addendum)
03/17/16 0830  Charting Type  Charting Type Shift assessment  Orders Chart Check (once per shift) Completed  Neurological  Neuro (WDL) WDL  Level of Consciousness Alert  Orientation Level Oriented X4  Cognition Appropriate at baseline;Follows commands  Speech Clear  Glasgow Coma Scale  Eye Opening 4  Best Verbal Response (NON-intubated) 5  Best Motor Response 6  Glasgow Coma Scale Score 15  [place] Agitation Sedation Scale  [place] Agitation Sedation Scale (RASS) 0  RASS Goal 0  CAM-ICU Feature 1:  Acute onset of fluctuating course  Is the patient different from his/her baseline mental status? No  Has the patient had any fluctuation in mental status in the past 24 hours? No  CAM-ICU Feature 4:  Disorganized thinking  CAM-ICU Negative? Yes  Delirium Prevention:  Universal Requirements (Complete for all ICU patients)  Universal Precautions Initiated *See Row Information* Yes  HEENT  HEENT (WDL) X  Vision Check No  Teeth Missing (Comment);Dentures lower  Tongue Pink;Moist  Mucous Membrane(s) Moist;Pink  Voice Clear  Respiratory  Respiratory (WDL) X  Cough None  Respiratory Pattern Regular  Chest Assessment Chest expansion symmetrical  Bilateral Breath Sounds Clear;Diminished  Cardiac  Cardiac (WDL) X  Pulse Irregular  Heart Sounds S1, S2;No adventitious heart sounds;Murmur  Jugular Venous Distention (JVD) Yes  Cardiac Rhythm Atrial fibrillation  Ectopy Unifocal PVC's  Telemetry Box Number [id number]  Tele Box Verification Completed by Second Verifier Completed  Antiarrhythmic device No  Vascular  Vascular (WDL) WDL  Integumentary  Integumentary (WDL) X  Skin Color Appropriate for ethnicity  Skin Condition Dry  Skin Integrity Abrasion  Abrasion Location Arm  Abrasion Location Orientation Bilateral  Abrasion Intervention Other (Comment) (assessed)  Skin Turgor Non-tenting  Sacral Foam Prophylactic Dressing Protocol  Dressing Interventions Dressing intact   Dressing Change Due 03/20/16  Braden Scale (Ages 8 and up)  Sensory Perceptions 3  Moisture 3  Activity 1  Mobility 3  Nutrition 2  Friction and Shear 3  Braden Scale Score 15  Braden Interventions  Braden Scale Interventions Heels  Musculoskeletal  Musculoskeletal (WDL) WDL  Assistive Device None  Gastrointestinal  Gastrointestinal (WDL) WDL  GU Assessment  Genitourinary (WDL) WDL  Urine Characteristics  Urinary Incontinence No  Psychosocial  Psychosocial (WDL) WDL

## 2016-03-17 NOTE — Progress Notes (Signed)
[facility] - [place] at [facility]   PATIENT NAME: Kirk Cortez    MR#:  [id number]  DATE OF BIRTH:  [birth year]  SUBJECTIVE:  CHIEF COMPLAINT:   Chief Complaint  Patient presents with  . Loss of Consciousness  The patient is 56 year old Caucasian male with past medical history significant for history of pulmonary hypertension, coronary artery disease, cardiomyopathy with ejection fraction of 20-25% , hypertension, atrial fibrillation, pulmonary embolism, gastrointestinal bleed, polysubstance abuse, who presents to the hospital with complaints of syncopal episode. He was noted to have low blood pressure and his hemoglobin level was found to be low, lactate level was also found to be increased. Patient was transfused packed red blood cells, and admitted. While during radiologic evaluation, patient had seizure episode. The patient feels somewhat better today, complains of some cough and creamy full phlegm production, thick, some shortness of breath. Heart rate remains fast at around 110, in atrial fibrillation. Blood pressure has improved with blood transfusion to 120 systolic. He feels relatively good, wants to go home. Wants to eat, now on clear liquids. No gastrointestinal bleeding was noted. Hemoglobin level remains stable at 8.9. Patient is not sure why he did not have EGD, colonoscopy in [facility]. Admits of drinking alcohol, not every day  Review of Systems  Unable to perform ROS: Medical condition    VITAL SIGNS: Blood pressure 116/89, pulse (!) 110, temperature 98.4 F (36.9 C), temperature source Oral, resp. rate 17, height 5\' 10"  (1.778 m), weight 101.8 kg (224 lb 6.9 oz), SpO2 96 %.  PHYSICAL EXAMINATION:   GENERAL:  56 y.o.-year-old patient lying in the bed with no acute distress, Somewhat dyspneic, slurring speech.  EYES: Pupils equal, round, reactive to light and accommodation. No scleral icterus. Extraocular muscles intact.  HEENT: Head atraumatic,  normocephalic. Oropharynx and nasopharynx clear.  NECK:  Supple, no jugular venous distention. No thyroid enlargement, no tenderness.  LUNGS: Normal breath sounds bilaterally, no wheezing, , scattered rales,rhonchi, but no crepitations.intermittent use  of accessory muscles of respiration, Especially with speech or moving around. Intermittent cough.  CARDIOVASCULAR: S1, S2tachycardic, irregularly irregular.2/6 systolic murmur was heard with some radiation to the left axilla, no rubs, or gallops.  ABDOMEN: Soft, nontender, nondistended. Bowel sounds present. No organomegaly or mass.  EXTREMITIES:2+ lower extremity and pedal  edema,no  cyanosis, or clubbing.  NEUROLOGIC: Cranial nerves II through XII are intact. Muscle strength 5/5 in all extremities. Sensation intact. Gait not checked.  PSYCHIATRIC: The patient is alert and oriented x 3. Slurring speech, difficult to understand  SKIN: No obvious rash, lesion, or ulcer.   ORDERS/RESULTS REVIEWED:   CBC  Recent Labs Lab 03/15/16 2351 03/16/16 0422 03/16/16 1247 03/17/16 0405  WBC 9.6 9.1  --  6.2  HGB 7.3* 7.5* 9.1* 8.9*  HCT 24.2* 24.2* 29.5* 27.9*  PLT 468* 422  --  411  MCV 77.3* 77.2*  --  76.9*  MCH 23.2* 23.9*  --  24.4*  MCHC 30.0* 30.9*  --  31.8*  RDW 22.0* 22.2*  --  22.2*   ------------------------------------------------------------------------------------------------------------------  Chemistries   Recent Labs Lab 03/15/16 2351 03/16/16 0542 03/17/16 0405  NA 134* 133* 134*  K 4.6 4.6 4.1  CL 98* 101 101  CO2 22 23 26   GLUCOSE 106* 108* 98  BUN 86* 80* 60*  CREATININE 2.54* 2.28* 1.21  CALCIUM 8.6* 8.2* 8.9  AST  --  13* 18  ALT  --  19 28  ALKPHOS  --  81  92  BILITOT  --  0.9 1.1   ------------------------------------------------------------------------------------------------------------------ estimated creatinine clearance is 81.5 mL/min (by C-G formula based on SCr of 1.21  mg/dL). ------------------------------------------------------------------------------------------------------------------  Recent Labs  03/16/16 2230  TSH 2.019    Cardiac Enzymes  Recent Labs Lab 03/16/16 2230 03/17/16 0405 03/17/16 1048  TROPONINI 0.03* 0.03* <0.03   ------------------------------------------------------------------------------------------------------------------ Invalid input(s): POCBNP ---------------------------------------------------------------------------------------------------------------  RADIOLOGY: Ct Abdomen Pelvis Wo Contrast  Result Date: 03/16/2016 CLINICAL DATA:  Right upper quadrant pain. Congestive heart failure. COPD. Syncope. Decreased hemoglobin. EXAM: CT ABDOMEN AND PELVIS WITHOUT CONTRAST TECHNIQUE: Multidetector CT imaging of the abdomen and pelvis was performed following the standard protocol without IV contrast. COMPARISON:  None. FINDINGS: Lower chest: Right base airspace disease with air bronchograms. Surrounding reticulonodular opacity. There is subtle left lower lobe reticulonodular opacity well. Moderate cardiomegaly with right coronary artery atherosclerosis. Trace right pleural fluid. Hepatobiliary: Hepatomegaly at 19.9 cm craniocaudal. Normal gallbladder, without biliary ductal dilatation. Pancreas: Normal, without mass or ductal dilatation. Spleen: Normal in size, without focal abnormality. Adrenals/Urinary Tract: Mild low density right adrenal nodularity, likely due to an adenoma at 1.6 cm. Normal left adrenal gland. Bilateral punctate renal collecting system calculi, without hydronephrosis or hydroureter. No bladder calculi. Stomach/Bowel: Normal stomach, without wall thickening. Normal colon and terminal ileum. Normal small bowel. Vascular/Lymphatic: Aortic and branch vessel atherosclerosis. No abdominopelvic adenopathy. Reproductive: Normal prostate. Other: Small volume cul-de-sac fluid and perihepatic ascites. No free intraperitoneal  air. Bilateral inguinal hernia repair. Tiny fat containing ventral abdominal wall hernia. Musculoskeletal: Thoracolumbar spondylosis. IMPRESSION: 1. Right lower lobe airspace disease is suspicious for pneumonia. Right greater than left reticulonodular opacities are also likely infectious. 2. Small right pleural effusion. 3. Bilateral nephrolithiasis.  No obstructive uropathy. 4. Small volume abdominopelvic ascites. 5.  Coronary artery atherosclerosis. Aortic atherosclerosis. Electronically Signed   By: Jeronimo GreavesKyle  Talbot M.D.   On: 03/16/2016 17:28   Ct Head Wo Contrast  Result Date: 03/16/2016 CLINICAL DATA:  56 y/o  M; syncopal episode. EXAM: CT HEAD WITHOUT CONTRAST CT CERVICAL SPINE WITHOUT CONTRAST TECHNIQUE: Multidetector CT imaging of the head and cervical spine was performed following the standard protocol without intravenous contrast. Multiplanar CT image reconstructions of the cervical spine were also generated. CT of the cervical spine was repeated due to extensive motion artifact. COMPARISON:  09/16/2015 CT head FINDINGS: CT HEAD FINDINGS Brain: No evidence of acute infarction, hemorrhage, hydrocephalus, extra-axial collection or mass lesion/mass effect. Mild stable chronic microvascular ischemic changes of white matter. Spur Vascular: Mild calcific atherosclerosis of cavernous internal carotid arteries. Skull: Scalp thickening in the left paramedian parietal region probably representing contusion. No displaced calvarial fracture. Sinuses/Orbits: No acute finding. Other: None. CT CERVICAL SPINE FINDINGS Alignment: Straightening of cervical lordosis. C3-4 grade 1 anterolisthesis, likely degenerative. Skull base and vertebrae: No acute fracture. No primary bone lesion or focal pathologic process. Soft tissues and spinal canal: No prevertebral fluid or swelling. No visible canal hematoma. Disc levels: Cervical spondylosis with predominantly discogenic degenerative changes at the C5 through C7 levels as well as  prominent facet arthrosis on the left from C3 through C5. There is a central disc protrusion at C2-3 and contact with the anterior cord and multifactorial canal stenosis greatest at the C6-7 level where it is at least moderate. Upper chest: Negative. Other: 7 mm right thyroid lobe nodule. Moderate calcific atherosclerosis of carotid bifurcations bilaterally. Soft tissue thickening of the posterior commissure of the glottis (series 17, image 59) not clearly present on the motion degraded first acquisition, probably related  to phonation. IMPRESSION: 1. Left paramedian parietal scalp soft tissue thickening probably representing a small contusion. No displaced calvarial fracture. 2. No acute intracranial abnormality. 3. Stable mild chronic microvascular ischemic changes of the brain. 4. No acute fracture or dislocation of cervical spine. 5. Cervical spondylosis greatest at C6-7 with there is multifactorial at least moderate canal stenosis. 6. Moderate calcific atherosclerosis of carotid bifurcations bilaterally. Electronically Signed   By: Mitzi Hansen M.D.   On: 03/16/2016 01:27   Ct Cervical Spine Wo Contrast  Result Date: 03/16/2016 CLINICAL DATA:  56 y/o  M; syncopal episode. EXAM: CT HEAD WITHOUT CONTRAST CT CERVICAL SPINE WITHOUT CONTRAST TECHNIQUE: Multidetector CT imaging of the head and cervical spine was performed following the standard protocol without intravenous contrast. Multiplanar CT image reconstructions of the cervical spine were also generated. CT of the cervical spine was repeated due to extensive motion artifact. COMPARISON:  09/16/2015 CT head FINDINGS: CT HEAD FINDINGS Brain: No evidence of acute infarction, hemorrhage, hydrocephalus, extra-axial collection or mass lesion/mass effect. Mild stable chronic microvascular ischemic changes of white matter. Spur Vascular: Mild calcific atherosclerosis of cavernous internal carotid arteries. Skull: Scalp thickening in the left paramedian  parietal region probably representing contusion. No displaced calvarial fracture. Sinuses/Orbits: No acute finding. Other: None. CT CERVICAL SPINE FINDINGS Alignment: Straightening of cervical lordosis. C3-4 grade 1 anterolisthesis, likely degenerative. Skull base and vertebrae: No acute fracture. No primary bone lesion or focal pathologic process. Soft tissues and spinal canal: No prevertebral fluid or swelling. No visible canal hematoma. Disc levels: Cervical spondylosis with predominantly discogenic degenerative changes at the C5 through C7 levels as well as prominent facet arthrosis on the left from C3 through C5. There is a central disc protrusion at C2-3 and contact with the anterior cord and multifactorial canal stenosis greatest at the C6-7 level where it is at least moderate. Upper chest: Negative. Other: 7 mm right thyroid lobe nodule. Moderate calcific atherosclerosis of carotid bifurcations bilaterally. Soft tissue thickening of the posterior commissure of the glottis (series 17, image 59) not clearly present on the motion degraded first acquisition, probably related to phonation. IMPRESSION: 1. Left paramedian parietal scalp soft tissue thickening probably representing a small contusion. No displaced calvarial fracture. 2. No acute intracranial abnormality. 3. Stable mild chronic microvascular ischemic changes of the brain. 4. No acute fracture or dislocation of cervical spine. 5. Cervical spondylosis greatest at C6-7 with there is multifactorial at least moderate canal stenosis. 6. Moderate calcific atherosclerosis of carotid bifurcations bilaterally. Electronically Signed   By: Mitzi Hansen M.D.   On: 03/16/2016 01:27   Dg Chest Portable 1 View  Result Date: 03/16/2016 CLINICAL DATA:  Syncopal episode tonight.  Lethargic. EXAM: PORTABLE CHEST 1 VIEW COMPARISON:  02/13/2016 FINDINGS: Cardiac enlargement without vascular congestion. No edema or consolidation. No blunting of costophrenic  angles. No pneumothorax. Calcification of the aorta. IMPRESSION: Cardiac enlargement.  No evidence of active pulmonary disease. Electronically Signed   By: Burman Nieves M.D.   On: 03/16/2016 00:43    EKG:  Orders placed or performed during the hospital encounter of 03/15/16  . ED EKG  . ED EKG  . EKG 12-Lead  . EKG 12-Lead    ASSESSMENT AND PLAN:  Principal Problem:   Hypotension  #1. Syncopal episode, Suspected hypotension related, most recent echocardiogram done at [facility] was January 2018, revealing ejection fraction of 20-25%, severe mitral regurgitation, moderate to severe dilated left atrium, aortic sclerosis, reduced right ventricular systolic function, severely elevated pulmonary systolic pressure,  echocardiogram was done  today. Get carotid ultrasound today as patient is more hemodynamically stable. Appreciate cardiology's input.  #2. Seizure episode, neurology consultation is requested, possibly related to hypotension/hypoxia, seizure precautions, Ativan as needed. Getting an electroencephalogram, brain MRI. No recurrences of seizures were noted #3. Right-sided heart failure, continue Lasix intravenously, following in's and outs, weight, blood pressure, kidney function  #4. Acute on chronic renal failure, resolved, follow closely with diuretics #5. Elevated troponin, likely demand ischemia, repeated troponin was normal, cardiac consultation is appreciated #6. Severe anemia, status post transfusion, hemoglobin level has improved and remains stable, no active bleeding was noted #7. Hyponatremia in fluid overloaded Patient, follow  with diuresis #8 A. fib atrial RVR, initiate Toprol, appreciate cardiologist input  #9. Suspected gastrointestinal bleed, no EGD or colonoscopy were performed during prior admission at [facility], outpatient endoscopies were recommended, no active bleeding was noted, awaiting for gastroenterologist input, continue PPI twice a day. Continue clear liquid diet.  #10.  Alcohol abuse, which was the role, initiate alcohol withdrawal scale if needed, #11. Tobacco abuse. Counseling, discussed this patient for approximately 4 minutes, nicotine replacement therapy is initiated Management plans discussed with the patient, family and they are in agreement.   DRUG ALLERGIES: No Known Allergies  CODE STATUS:  Code Status History    Date Active Date Inactive Code Status Order ID Comments User Context   09/16/2015  9:28 PM 09/19/2015 11:59 AM Full Code [id number]  Houston Siren, MD Inpatient   09/15/2015  5:51 AM 09/15/2015 12:06 PM Full Code [id number]  Ihor Austin, MD Inpatient      TOTAL TIME TAKING CARE OF THIS PATIENT: 40  minutes.  Discussed with cardiology  Angeliz Settlemyre M.D on 03/17/2016 at 3:14 PM  Between 7am to 6pm - Pager - [phone]  After 6pm go to [email or web address] - password [email or web address] [facility]  [facility] [place] [facility]  Office  [phone]  CC: Primary care physician; [facility]

## 2016-03-17 NOTE — Consult Note (Addendum)
Cardiology Consult    Patient ID: Kirk Cortez MRN: [id number], DOB/AGE: Jul 26, 1960   Admit date: 03/15/2016 Date of Consult: 03/17/2016  Primary Physician: [facility] Primary Cardiologist: Enriqueta Shutter, MD - [facility] Requesting Provider: Lenetta Quaker  Patient Profile    56 y/o ? with a h/o NICM, systolic CHF, noncompliance, chronic AFib, recent GIB (01/2016), nonobs CAD, HTN, HL, COPD, and polysubstance abuse (cocaine, heroin, tobacco, alcohol, oral opiates), who was admitted 2/6 after a syncopal episode and seizure.  Past Medical History   Past Medical History:  Diagnosis Date  . Asthma   . Chronic atrial fibrillation (HCC)    a. 2018 Anticoagulation d/c'd in setting of GIB.  Marland Kitchen Chronic systolic CHF (congestive heart failure) (HCC)    a. 02/2016 Echo: EF 20-25%, sev MR, mod-sev dil LA, RV dysfxn, mild to mod TR, Sev PAH, mod dil RA.  Marland Kitchen COPD (chronic obstructive pulmonary disease) (HCC)   . GI bleed    a. 01/2016 - Required 7units PRBCs.  . History of bialteral pulmonary emboli   . History of bilateral DVT (deep vein thrombosis)   . Hyperlipidemia   . Hypertension   . Mitral regurgitation    a. 02/2016 Echo: Mod-sev MR.  Marland Kitchen NICM (nonischemic cardiomyopathy) (HCC)    a. 02/2016 Echo: EF 20-25%.  . Non-obstructive CAD (coronary artery disease)    a. 07/2011 PTE CT: EF 22%, Cor Ca2+ involving LAD, RCA, LCX, moderate apical, basal ant, basal antlat, basal antsept, mid ant, mid antlat, and mid antsept ischemia;  b. 07/2011 Cath: LAD 60ost, 70.  . Polysubstance abuse    a. 40 pack year h/o tobacco abuse; b. uses cocaine and buys opiates "off the street" - says recently he's been using suboxone.    Past Surgical History:  Procedure Laterality Date  . APPENDECTOMY    . HERNIA REPAIR       Allergies  No Known Allergies  History of Present Illness    56 y/o ? with the above complex PMH including nonobs CAD (cath 2013), NICM/Chronic systolic CHF (EF 40-98% 02/2016), mod-severe MR,  chronic afib, recent GIB (7u prbcs @ [facility] in 01/2016 - xarelto d/c'd), HTN, HL, GERD, COPD, polysubstance abuse, and noncompliance.  He is followed in CHF clinic @ [facility] and requires what appears to be frequent outpatient IV lasix injections. Dry wt is believed to be about 210-214.  He was last seen @ [facility] on 2/2 and was given IV lasix that day. Wt was 214 lbs.  Meds were adjusted.  He was due to f/u on Tuesday 2/6 but did not show.  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  He said that he lives with some degree of orthostatic dizziness, but that day he was dizzy all day.  Just prior to reaching his car, he lost consciousness.  He remembers reaching for the door of his [name] but then his next memory is that of being evaluated by EMS while he lie in the parking lot.  He believes he was w/o consciousness for ~ 5 mins, based on what [employer] staff were saying.  Upon regaining consciousness, he says that he was mildy dyspneic but denies c/p or palpitations.  [facility] was [facility] to [facility] ED where he was tachycardic in the setting of chronic Afib.  He was also hypotensive and anemic (H/H 7.3/24.2). CT of the Head was ordered and in transit,  he had sz activity. No acute intracranial findings on CT. He was admitted by medicine with plan for neuro consult.  He was transfused 2 units prbcs.  CXR showed pna and chf.  He is now on abx.  Troponin has been mildly elevated @ 0.03 x 3.  UDS + for benzos, cocaine, opiates, and tricyclics.  ETOH 33.  Cardiology has been asked to eval.  He currently denies c/p.  He has had DOE, edema, and orthopnea [facility]  Wt is up 10 lbs since then.  Inpatient Medications    . atorvastatin  80 mg Oral QPM  . [START ON 03/18/2016] azithromycin  500 mg Intravenous Q24H  . cefTRIAXone  1 g Intravenous Q24H  . folic acid  1 mg Oral Daily  . ipratropium  0.5 mg Nebulization Q6H  . LORazepam  0-4 mg  Intravenous Q6H   Followed by  . [START ON 03/18/2016] LORazepam  0-4 mg Intravenous Q12H  . magnesium oxide  400 mg Oral BID  . multivitamin with minerals  1 tablet Oral Daily  . nicotine  14 mg Transdermal Daily  . pantoprazole (PROTONIX) IV  40 mg Intravenous Q12H  . sodium chloride flush  3 mL Intravenous Q12H  . sodium chloride flush  3 mL Intravenous Q12H  . thiamine  100 mg Oral Daily   Or  . thiamine  100 mg Intravenous Daily  . tiotropium  18 mcg Inhalation Daily    Family History    Family History  Problem Relation Age of Onset  . Cancer Mother   . Heart attack Father     Social History    Social History   Social History  . Marital status: Single    Spouse name: N/A  . Number of children: N/A  . Years of education: N/A   Occupational History  . disabled    Social History Main Topics  . Smoking status: Current Every Day Smoker    Packs/day: 1.00    Years: 40.00  . Smokeless tobacco: Never Used  . Alcohol use Yes     Comment: "3 drinks/week"  . Drug use: Yes    Types: Heroin, Cocaine, Benzodiazepines  . Sexual activity: Not on file   Other Topics Concern  . Not on file   Social History Narrative  . No narrative on file     Review of Systems    General:  No chills, fever, night sweats or weight changes.  Cardiovascular:  No chest pain, +++ dyspnea on exertion, +++ LE edema, +++ orthopnea, no palpitations, paroxysmal nocturnal dyspnea.  +++ syncope as outlined above. Dermatological: No rash, lesions/masses Respiratory: +++ cough, dyspnea Urologic: No hematuria, dysuria Abdominal:   No nausea, vomiting, diarrhea, bright red blood per rectum, melena, or hematemesis Neurologic:  No visual changes, wkns, changes in mental status. All other systems reviewed and are otherwise negative except as noted above.  Physical Exam    Blood pressure 116/78, pulse (!) 104, temperature 98.4 F (36.9 C), temperature source Oral, resp. rate 14, height 5\' 10"  (1.778  m), weight 224 lb 6.9 oz (101.8 kg), SpO2 96 %.  General: NAD.  Angry that he has not been given a diet. Psych: Flat affect, somewhat agitated. Neuro: Alert and oriented X 3. Moves all extremities spontaneously. HEENT: Normal  Neck: Supple without bruits.  JVP ~ 14 cm. Lungs:  Resp regular and unlabored, scattered rhonchi throughout - very coarse breath sounds.   Heart: Distant, IR,  IR, tachy, no s3, s4, soft syst murmur @ apex. Abdomen: Soft, non-tender, non-distended, BS + x 4.  Extremities: No clubbing, cyanosis or 1+ bilat LE edema. DP/PT/Radials 2+ and equal bilaterally.  Labs     Recent Labs  03/16/16 1247 03/16/16 1603 03/16/16 2230 03/17/16 0405  TROPONINI <0.03 0.03* 0.03* 0.03*   Lab Results  Component Value Date   WBC 6.2 03/17/2016   HGB 8.9 (L) 03/17/2016   HCT 27.9 (L) 03/17/2016   MCV 76.9 (L) 03/17/2016   PLT 411 03/17/2016     Recent Labs Lab 03/17/16 0405  NA 134*  K 4.1  CL 101  CO2 26  BUN 60*  CREATININE 1.21  CALCIUM 8.9  PROT 7.2  BILITOT 1.1  ALKPHOS 92  ALT 28  AST 18  GLUCOSE 98   Lab Results  Component Value Date   CHOL 232 (H) 07/29/2012   HDL 92 (H) 07/29/2012   LDLCALC 122 (H) 07/29/2012   TRIG 88 07/29/2012    Radiology Studies    Ct Abdomen Pelvis Wo Contrast  Result Date: 03/16/2016 CLINICAL DATA:  Right upper quadrant pain. Congestive heart failure. COPD. Syncope. Decreased hemoglobin. EXAM: CT ABDOMEN AND PELVIS WITHOUT CONTRAST TECHNIQUE: Multidetector CT imaging of the abdomen and pelvis was performed following the standard protocol without IV contrast. COMPARISON:  None. FINDINGS: Lower chest: Right base airspace disease with air bronchograms. Surrounding reticulonodular opacity. There is subtle left lower lobe reticulonodular opacity well. Moderate cardiomegaly with right coronary artery atherosclerosis. Trace right pleural fluid. Hepatobiliary: Hepatomegaly at 19.9 cm craniocaudal. Normal gallbladder, without biliary  ductal dilatation. Pancreas: Normal, without mass or ductal dilatation. Spleen: Normal in size, without focal abnormality. Adrenals/Urinary Tract: Mild low density right adrenal nodularity, likely due to an adenoma at 1.6 cm. Normal left adrenal gland. Bilateral punctate renal collecting system calculi, without hydronephrosis or hydroureter. No bladder calculi. Stomach/Bowel: Normal stomach, without wall thickening. Normal colon and terminal ileum. Normal small bowel. Vascular/Lymphatic: Aortic and branch vessel atherosclerosis. No abdominopelvic adenopathy. Reproductive: Normal prostate. Other: Small volume cul-de-sac fluid and perihepatic ascites. No free intraperitoneal air. Bilateral inguinal hernia repair. Tiny fat containing ventral abdominal wall hernia. Musculoskeletal: Thoracolumbar spondylosis. IMPRESSION: 1. Right lower lobe airspace disease is suspicious for pneumonia. Right greater than left reticulonodular opacities are also likely infectious. 2. Small right pleural effusion. 3. Bilateral nephrolithiasis.  No obstructive uropathy. 4. Small volume abdominopelvic ascites. 5.  Coronary artery atherosclerosis. Aortic atherosclerosis. Electronically Signed   By: Jeronimo Greaves M.D.   On: 03/16/2016 17:28   Ct Head Wo Contrast  Result Date: 03/16/2016 CLINICAL DATA:  56 y/o  M; syncopal episode. EXAM: CT HEAD WITHOUT CONTRAST CT CERVICAL SPINE WITHOUT CONTRAST TECHNIQUE: Multidetector CT imaging of the head and cervical spine was performed following the standard protocol without intravenous contrast. Multiplanar CT image reconstructions of the cervical spine were also generated. CT of the cervical spine was repeated due to extensive motion artifact. COMPARISON:  09/16/2015 CT head FINDINGS: CT HEAD FINDINGS Brain: No evidence of acute infarction, hemorrhage, hydrocephalus, extra-axial collection or mass lesion/mass effect. Mild stable chronic microvascular ischemic changes of white matter. Spur Vascular:  Mild calcific atherosclerosis of cavernous internal carotid arteries. Skull: Scalp thickening in the left paramedian parietal region probably representing contusion. No displaced calvarial fracture. Sinuses/Orbits: No acute finding. Other: None. CT CERVICAL SPINE FINDINGS Alignment: Straightening of cervical lordosis. C3-4 grade 1 anterolisthesis, likely degenerative. Skull base and vertebrae: No acute fracture. No primary bone lesion or focal pathologic process.  Soft tissues and spinal canal: No prevertebral fluid or swelling. No visible canal hematoma. Disc levels: Cervical spondylosis with predominantly discogenic degenerative changes at the C5 through C7 levels as well as prominent facet arthrosis on the left from C3 through C5. There is a central disc protrusion at C2-3 and contact with the anterior cord and multifactorial canal stenosis greatest at the C6-7 level where it is at least moderate. Upper chest: Negative. Other: 7 mm right thyroid lobe nodule. Moderate calcific atherosclerosis of carotid bifurcations bilaterally. Soft tissue thickening of the posterior commissure of the glottis (series 17, image 59) not clearly present on the motion degraded first acquisition, probably related to phonation. IMPRESSION: 1. Left paramedian parietal scalp soft tissue thickening probably representing a small contusion. No displaced calvarial fracture. 2. No acute intracranial abnormality. 3. Stable mild chronic microvascular ischemic changes of the brain. 4. No acute fracture or dislocation of cervical spine. 5. Cervical spondylosis greatest at C6-7 with there is multifactorial at least moderate canal stenosis. 6. Moderate calcific atherosclerosis of carotid bifurcations bilaterally. Electronically Signed   By: Mitzi Hansen M.D.   On: 03/16/2016 01:27   Ct Cervical Spine Wo Contrast  Result Date: 03/16/2016 CLINICAL DATA:  56 y/o  M; syncopal episode. EXAM: CT HEAD WITHOUT CONTRAST CT CERVICAL SPINE  WITHOUT CONTRAST TECHNIQUE: Multidetector CT imaging of the head and cervical spine was performed following the standard protocol without intravenous contrast. Multiplanar CT image reconstructions of the cervical spine were also generated. CT of the cervical spine was repeated due to extensive motion artifact. COMPARISON:  09/16/2015 CT head FINDINGS: CT HEAD FINDINGS Brain: No evidence of acute infarction, hemorrhage, hydrocephalus, extra-axial collection or mass lesion/mass effect. Mild stable chronic microvascular ischemic changes of white matter. Spur Vascular: Mild calcific atherosclerosis of cavernous internal carotid arteries. Skull: Scalp thickening in the left paramedian parietal region probably representing contusion. No displaced calvarial fracture. Sinuses/Orbits: No acute finding. Other: None. CT CERVICAL SPINE FINDINGS Alignment: Straightening of cervical lordosis. C3-4 grade 1 anterolisthesis, likely degenerative. Skull base and vertebrae: No acute fracture. No primary bone lesion or focal pathologic process. Soft tissues and spinal canal: No prevertebral fluid or swelling. No visible canal hematoma. Disc levels: Cervical spondylosis with predominantly discogenic degenerative changes at the C5 through C7 levels as well as prominent facet arthrosis on the left from C3 through C5. There is a central disc protrusion at C2-3 and contact with the anterior cord and multifactorial canal stenosis greatest at the C6-7 level where it is at least moderate. Upper chest: Negative. Other: 7 mm right thyroid lobe nodule. Moderate calcific atherosclerosis of carotid bifurcations bilaterally. Soft tissue thickening of the posterior commissure of the glottis (series 17, image 59) not clearly present on the motion degraded first acquisition, probably related to phonation. IMPRESSION: 1. Left paramedian parietal scalp soft tissue thickening probably representing a small contusion. No displaced calvarial fracture. 2. No  acute intracranial abnormality. 3. Stable mild chronic microvascular ischemic changes of the brain. 4. No acute fracture or dislocation of cervical spine. 5. Cervical spondylosis greatest at C6-7 with there is multifactorial at least moderate canal stenosis. 6. Moderate calcific atherosclerosis of carotid bifurcations bilaterally. Electronically Signed   By: Mitzi Hansen M.D.   On: 03/16/2016 01:27   Dg Chest Portable 1 View  Result Date: 03/16/2016 CLINICAL DATA:  Syncopal episode tonight.  Lethargic. EXAM: PORTABLE CHEST 1 VIEW COMPARISON:  02/13/2016 FINDINGS: Cardiac enlargement without vascular congestion. No edema or consolidation. No blunting of costophrenic angles. No pneumothorax.  Calcification of the aorta. IMPRESSION: Cardiac enlargement.  No evidence of active pulmonary disease. Electronically Signed   By: Burman Nieves M.D.   On: 03/16/2016 00:43    ECG & Cardiac Imaging    Afib, 113, IVCD  Assessment & Plan    1.  Syncope: Pt presented to the ED on 2/6 following a syncopal episode that occurred in the parking lot of a [employer].  He was reportedly w/o consciousness for 5 mins.  Initial rhythm upon EMS arrival was Afib.  In ER he was tachycardic and hypotensive in the setting of anemia (H/H 7.3/24.2).  UDS + for benzos, cocaine, opiates, and tricyclics.  ETOH 33.  He denies using cocaine recently but says he gets narcotics 'off the street.'  He does have a h/o NICM w/ an EF of 20-25% and thus is @ risk for VT, esp in setting of cocaine usage.  That said current presentation likely multifactorial (drug/alcohol use, PNA/CHF on cxr/Anemia w/ hypotension).  I presume he has not previously been felt to be a candidate for ICD therapy 2/2 ongoing cocaine usage.  Continue to follow tele.  Repeat echo pending.  Would plan to add back  blocker once BP shows stability (currently 1-teens), but would choose coreg over metoprolol given risk of unopposed alpha in setting of cocaine use.  He  needs to understand this risk.  2.  Acute on chronic systolic CHF/NICM:  Prior h/o nonobs CAD in 2013. EF 20-25%.  On good meds @ home but is noncompliant with meds, diet, lifestyle, and f/u.  Freq requires IV lasix @ [facility] outpt CHF clinic (last 2/2;  Missed appt 2/6).   blocker, acei, metolazone, and lasix were initially held in setting of hypotension. He is up 10 lbs since last Friday with volume overload on exam.  He says that he watches his diet closely but was found @ a [employer].  I will resume lasix 40 IV bid.  See how he does w/ that and if bp stable, plan to slowly resume other meds.  He will need early f/u @ [facility] following d/c.  3.  Acute on chronic anemia:  He was admitted to [facility] in December w/ GIB and required 7 units or prbcs - Hgb was 4 on admission.  Its not clear what GI w/u may have been performed.  According to D/c summary dated 01/30/2016, he would have outpt GI EGD and colonoscopy.    4.  Seizures:  Prior h/o sz in setting of encephalopathy and etoh w/d.  Had sz on way to CT in ED.  Per IM/neuro.  5.  Chronic Afib:  Review of notes reveals difficult rate control in the past.  Pressures previously soft on  blocker and or dilt (not ideal in setting of low EF anyway).  Was placed on digoxin @ d/c from [facility] 1/10, but he left AMA and never got Rx.  Was not on @ last clinic visit.  Currently  blocker on hold in setting of soft bps.  Will plan to resume  blocker as pressure stabilizes, though need to make clear that he increases his risk of VT/death if using cocaine concomitantly.  No OAC in setting of recent GIB and recurrent anemia this admission.  Rates on admission likely driven by anemia/hypotension.  No role for antiarrhythmic therapy as we would not necessarily want him to convert in the absence of anticoagulation.  6.  Polysubstance Abuse:  Ongoing - heroin, cocaine, etoh, tobacco.  Says he's more recently  been buying suboxone to help him come off of drugs.  ? If SW  assistance would be valuable to him..  7.  Elevated trop:  Mild/flat elevation.  Not ACS.  Likely 2/2 demand ischemia in the setting of all of the above.  8.  PNA:  Abx per IM.  Signed, Nicolasa Ducking, NP 03/17/2016, 10:31 AM   Attending Note Patient seen and examined, agree with detailed note above,  Patient presentation and plan discussed on rounds.  Cardiology consult placed by Dr.  Tobi Bastos for acute on chronic systolic CHF  EKG lab work, chest x-ray, echocardiogram reviewed independently by myself  56 year old gentleman with nonischemic cardiomyopathy, long history of substance abuse, noncompliance with his medications, COPD, presenting with syncope, seizure Full details as above Typically followed by [facility] CHF clinic Presenting after orthostatic dizziness during the day, eating to episode of syncope In the emergency room was in atrial fibrillation with RVR, hypertensive, anemic hematocrit 24 Transfused 2 units Negative cardiac enzymes 3,UDS + for benzos, cocaine, opiates, and tricyclics. Alcohol 33 At home he reports he is taking 120 mg of Lasix daily. Unclear if he is compliant  EKG on arrival showing atrial fibrillation with ventricular rate 113 bpm, intraventricular conduction delay   on physical exam, no distress, distended neck veins 12 cm, lungs with mildly decreased breath sounds throughout, heart sounds irregular with no murmurs appreciated, abdomen soft nontender, trace nonpitting lower extremity edema  Lab work reviewed showing normal TSH, negative cardiac enzymes, creatinine 1.2 with BUN of 60, potassium 4.1, sodium 134 On arrival creatinine 2.28, BUN 80   -Syncope Numerous issues that could have contributed to his syncopal episode including alcohol, mixed drugs as detailed above, anemia, hypertension Unable to exclude arrhythmia Currently not on beta blockers given recent cocaine use Will monitor on telemetry for now. Is undergoing GI workup for  anemia  --Acute on chronic systolic CHF Followed at [facility], medication noncompliance, missed recent appointment February 6 Suspect he may need to change to torsemide with metolazone as an outpatient He reports Lasix is not working for him. Previously taking Lasix 120 g daily, perhaps could take torsemide 60 mg daily  --- Anemia Likely major contributed to his presentation GI scheduled to see the patient, currently on clear diet  --- Chronic atrial fibrillation Could treat with low-dose beta blocker though he has recent cocaine use Would monitor heart rate for now, better than on admission after transfusion  ----Polysubstance abuse Contribute to his ongoing care. cocaine limiting use of beta blocker.  Leading to noncompliance,   alcohol possibly leading to seizure type activity  Long discussion with him concerning the above  Greater than 50% was spent in counseling and coordination of care with patient Total encounter time 110 minutes or more   Signed: Dossie Arbour  M.D., Ph.D. [facility]

## 2016-03-17 NOTE — Progress Notes (Signed)
*  PRELIMINARY RESULTS* Echocardiogram 2D Echocardiogram has been performed.  Kirk Cortez, Kirk Cortez 03/17/2016, 10:43 AM

## 2016-03-17 NOTE — Consult Note (Signed)
[name], MD [facility]  [address] [place], [place] [address] Phone: [phone] Fax : [phone]  Consultation  Referring Provider:     Dr. Winona Legato  Primary Care Physician:  [facility] Primary Gastroenterologist:  None         Reason for Consultation:     Anemia  Date of Admission:  03/15/2016 Date of Consultation:  03/17/2016         HPI:   Kirk Cortez is a 56 y.o. male Who was admitted to the hospital with syncope.  The patient was found on admission to have a hemoglobin of 7.3 with his previous hemoglobin back in August 2017 being 11.2. The patient also has a history of polysubstance abuse with cocaine and heroin tobacco alcohol and opiates.  He had a history of a GI bleed recently in December 2017.  The patient was supposed to undergo an EGD and colonoscopy as an outpatient. The patient also has a history of coronary artery disease and congestive heart failure with moderate to severe mitral regurgitation.  The patient also tested positive for try cyclic scope cane opioids and benzodiazepines on admission.  The patient states that he has had dark stools.  He also reports that he has been taking BCs every day.  Past Medical History:  Diagnosis Date  . Asthma   . Chronic atrial fibrillation (HCC)    a. 2018 Anticoagulation d/c'd in setting of GIB.  Marland Kitchen Chronic systolic CHF (congestive heart failure) (HCC)    a. 02/2016 Echo: EF 20-25%, sev MR, mod-sev dil LA, RV dysfxn, mild to mod TR, Sev PAH, mod dil RA.  Marland Kitchen COPD (chronic obstructive pulmonary disease) (HCC)   . GI bleed    a. 01/2016 - Required 7units PRBCs.  . History of bialteral pulmonary emboli   . History of bilateral DVT (deep vein thrombosis)   . Hyperlipidemia   . Hypertension   . Mitral regurgitation    a. 02/2016 Echo: Mod-sev MR.  Marland Kitchen NICM (nonischemic cardiomyopathy) (HCC)    a. 02/2016 Echo: EF 20-25%.  . Non-obstructive CAD (coronary artery disease)    a. 07/2011 PTE CT: EF 22%, Cor Ca2+ involving  LAD, RCA, LCX, moderate apical, basal ant, basal antlat, basal antsept, mid ant, mid antlat, and mid antsept ischemia;  b. 07/2011 Cath: LAD 60ost, 70.  . Polysubstance abuse    a. 40 pack year h/o tobacco abuse; b. uses cocaine and buys opiates "off the street" - says recently he's been using suboxone.    Past Surgical History:  Procedure Laterality Date  . APPENDECTOMY    . HERNIA REPAIR      Prior to Admission medications   Medication Sig Start Date End Date Taking? Authorizing Provider  albuterol (ACCUNEB) 1.25 MG/3ML nebulizer solution Take 1.25 mg by nebulization every 6 (six) hours as needed. 08/06/15 08/05/16 Yes Historical Provider, MD  aspirin EC 81 MG tablet Take 81 mg by mouth daily. 04/11/13  Yes Historical Provider, MD  atorvastatin (LIPITOR) 80 MG tablet Take 80 mg by mouth every evening. 08/06/15  Yes Historical Provider, MD  furosemide (LASIX) 80 MG tablet Take 80 mg by mouth 2 (two) times daily.   Yes Historical Provider, MD  ipratropium (ATROVENT HFA) 17 MCG/ACT inhaler Inhale 2 puffs into the lungs every 6 (six) hours.   Yes Historical Provider, MD  lisinopril (PRINIVIL,ZESTRIL) 2.5 MG tablet Take 2.5 mg by mouth daily.   Yes Historical Provider, MD  magnesium oxide (MAG-OX) 400 MG tablet Take  400 mg by mouth 2 (two) times daily. 07/02/15 07/01/16 Yes Historical Provider, MD  metoprolol succinate (TOPROL-XL) 25 MG 24 hr tablet Take 12.5 mg by mouth daily.   Yes Historical Provider, MD  pantoprazole (PROTONIX) 40 MG tablet Take 40 mg by mouth 2 (two) times daily.   Yes Historical Provider, MD  sertraline (ZOLOFT) 50 MG tablet Take 50 mg by mouth daily.   Yes Historical Provider, MD  tiotropium (SPIRIVA) 18 MCG inhalation capsule Place 18 mcg into inhaler and inhale daily.   Yes Historical Provider, MD  metolazone (ZAROXOLYN) 2.5 MG tablet Take 2.5 mg by mouth daily.    Historical Provider, MD    Family History  Problem Relation Age of Onset  . Cancer Mother   . Heart attack  Father      Social History  Substance Use Topics  . Smoking status: Current Every Day Smoker    Packs/day: 1.00    Years: 40.00  . Smokeless tobacco: Never Used  . Alcohol use Yes     Comment: "3 drinks/week"    Allergies as of 03/15/2016  . (No Known Allergies)    Review of Systems:    All systems reviewed and negative except where noted in HPI.   Physical Exam:  Vital signs in last 24 hours: Temp:  [97.8 F (36.6 C)-98.4 F (36.9 C)] 98.4 F (36.9 C) (02/08 2028) Pulse Rate:  [100-148] 123 (02/08 2028) Resp:  [13-27] 16 (02/08 2028) BP: (97-126)/(67-106) 97/69 (02/08 2028) SpO2:  [87 %-98 %] 97 % (02/08 2028) Weight:  [224 lb 6.9 oz (101.8 kg)] 224 lb 6.9 oz (101.8 kg) (02/07 2200)   General:   Pleasant, cooperative in NAD Head:  Normocephalic and atraumatic. Eyes:   No icterus.   Conjunctiva pink. PERRLA. Ears:  Normal auditory acuity. Neck:  Supple; no masses or thyroidomegaly Lungs: Respirations even and unlabored. Lungs clear to auscultation bilaterally.   No wheezes, crackles, or rhonchi.  Heart:  Regular rate and rhythm;  Without murmur, clicks, rubs or gallops Abdomen:  Soft, nondistended, nontender. Normal bowel sounds. No appreciable masses or hepatomegaly.  No rebound or guarding.  Rectal:  Not performed. Msk:  Symmetrical without gross deformities.   Extremities:  Without edema, cyanosis or clubbing. Neurologic:  Alert and oriented x3;  grossly normal neurologically. Skin:  Intact without significant lesions or rashes. Cervical Nodes:  No significant cervical adenopathy. Psych:  Alert and cooperative. Normal affect.  LAB RESULTS:  Recent Labs  03/15/16 2351 03/16/16 0422 03/16/16 1247 03/17/16 0405  WBC 9.6 9.1  --  6.2  HGB 7.3* 7.5* 9.1* 8.9*  HCT 24.2* 24.2* 29.5* 27.9*  PLT 468* 422  --  411   BMET  Recent Labs  03/15/16 2351 03/16/16 0542 03/17/16 0405  NA 134* 133* 134*  K 4.6 4.6 4.1  CL 98* 101 101  CO2 22 23 26   GLUCOSE  106* 108* 98  BUN 86* 80* 60*  CREATININE 2.54* 2.28* 1.21  CALCIUM 8.6* 8.2* 8.9   LFT  Recent Labs  03/16/16 0542 03/17/16 0405  PROT 7.0 7.2  ALBUMIN 3.1* 3.1*  AST 13* 18  ALT 19 28  ALKPHOS 81 92  BILITOT 0.9 1.1  BILIDIR 0.3  --   IBILI 0.6  --    PT/INR  Recent Labs  03/16/16 0542  LABPROT 18.7*  INR 1.55    STUDIES: Ct Abdomen Pelvis Wo Contrast  Result Date: 03/16/2016 CLINICAL DATA:  Right upper quadrant pain. Congestive heart  failure. COPD. Syncope. Decreased hemoglobin. EXAM: CT ABDOMEN AND PELVIS WITHOUT CONTRAST TECHNIQUE: Multidetector CT imaging of the abdomen and pelvis was performed following the standard protocol without IV contrast. COMPARISON:  None. FINDINGS: Lower chest: Right base airspace disease with air bronchograms. Surrounding reticulonodular opacity. There is subtle left lower lobe reticulonodular opacity well. Moderate cardiomegaly with right coronary artery atherosclerosis. Trace right pleural fluid. Hepatobiliary: Hepatomegaly at 19.9 cm craniocaudal. Normal gallbladder, without biliary ductal dilatation. Pancreas: Normal, without mass or ductal dilatation. Spleen: Normal in size, without focal abnormality. Adrenals/Urinary Tract: Mild low density right adrenal nodularity, likely due to an adenoma at 1.6 cm. Normal left adrenal gland. Bilateral punctate renal collecting system calculi, without hydronephrosis or hydroureter. No bladder calculi. Stomach/Bowel: Normal stomach, without wall thickening. Normal colon and terminal ileum. Normal small bowel. Vascular/Lymphatic: Aortic and branch vessel atherosclerosis. No abdominopelvic adenopathy. Reproductive: Normal prostate. Other: Small volume cul-de-sac fluid and perihepatic ascites. No free intraperitoneal air. Bilateral inguinal hernia repair. Tiny fat containing ventral abdominal wall hernia. Musculoskeletal: Thoracolumbar spondylosis. IMPRESSION: 1. Right lower lobe airspace disease is suspicious for  pneumonia. Right greater than left reticulonodular opacities are also likely infectious. 2. Small right pleural effusion. 3. Bilateral nephrolithiasis.  No obstructive uropathy. 4. Small volume abdominopelvic ascites. 5.  Coronary artery atherosclerosis. Aortic atherosclerosis. Electronically Signed   By: Jeronimo Greaves M.D.   On: 03/16/2016 17:28   Ct Head Wo Contrast  Result Date: 03/16/2016 CLINICAL DATA:  56 y/o  M; syncopal episode. EXAM: CT HEAD WITHOUT CONTRAST CT CERVICAL SPINE WITHOUT CONTRAST TECHNIQUE: Multidetector CT imaging of the head and cervical spine was performed following the standard protocol without intravenous contrast. Multiplanar CT image reconstructions of the cervical spine were also generated. CT of the cervical spine was repeated due to extensive motion artifact. COMPARISON:  09/16/2015 CT head FINDINGS: CT HEAD FINDINGS Brain: No evidence of acute infarction, hemorrhage, hydrocephalus, extra-axial collection or mass lesion/mass effect. Mild stable chronic microvascular ischemic changes of white matter. Spur Vascular: Mild calcific atherosclerosis of cavernous internal carotid arteries. Skull: Scalp thickening in the left paramedian parietal region probably representing contusion. No displaced calvarial fracture. Sinuses/Orbits: No acute finding. Other: None. CT CERVICAL SPINE FINDINGS Alignment: Straightening of cervical lordosis. C3-4 grade 1 anterolisthesis, likely degenerative. Skull base and vertebrae: No acute fracture. No primary bone lesion or focal pathologic process. Soft tissues and spinal canal: No prevertebral fluid or swelling. No visible canal hematoma. Disc levels: Cervical spondylosis with predominantly discogenic degenerative changes at the C5 through C7 levels as well as prominent facet arthrosis on the left from C3 through C5. There is a central disc protrusion at C2-3 and contact with the anterior cord and multifactorial canal stenosis greatest at the C6-7 level  where it is at least moderate. Upper chest: Negative. Other: 7 mm right thyroid lobe nodule. Moderate calcific atherosclerosis of carotid bifurcations bilaterally. Soft tissue thickening of the posterior commissure of the glottis (series 17, image 59) not clearly present on the motion degraded first acquisition, probably related to phonation. IMPRESSION: 1. Left paramedian parietal scalp soft tissue thickening probably representing a small contusion. No displaced calvarial fracture. 2. No acute intracranial abnormality. 3. Stable mild chronic microvascular ischemic changes of the brain. 4. No acute fracture or dislocation of cervical spine. 5. Cervical spondylosis greatest at C6-7 with there is multifactorial at least moderate canal stenosis. 6. Moderate calcific atherosclerosis of carotid bifurcations bilaterally. Electronically Signed   By: Mitzi Hansen M.D.   On: 03/16/2016 01:27   Ct  Cervical Spine Wo Contrast  Result Date: 03/16/2016 CLINICAL DATA:  56 y/o  M; syncopal episode. EXAM: CT HEAD WITHOUT CONTRAST CT CERVICAL SPINE WITHOUT CONTRAST TECHNIQUE: Multidetector CT imaging of the head and cervical spine was performed following the standard protocol without intravenous contrast. Multiplanar CT image reconstructions of the cervical spine were also generated. CT of the cervical spine was repeated due to extensive motion artifact. COMPARISON:  09/16/2015 CT head FINDINGS: CT HEAD FINDINGS Brain: No evidence of acute infarction, hemorrhage, hydrocephalus, extra-axial collection or mass lesion/mass effect. Mild stable chronic microvascular ischemic changes of white matter. Spur Vascular: Mild calcific atherosclerosis of cavernous internal carotid arteries. Skull: Scalp thickening in the left paramedian parietal region probably representing contusion. No displaced calvarial fracture. Sinuses/Orbits: No acute finding. Other: None. CT CERVICAL SPINE FINDINGS Alignment: Straightening of cervical  lordosis. C3-4 grade 1 anterolisthesis, likely degenerative. Skull base and vertebrae: No acute fracture. No primary bone lesion or focal pathologic process. Soft tissues and spinal canal: No prevertebral fluid or swelling. No visible canal hematoma. Disc levels: Cervical spondylosis with predominantly discogenic degenerative changes at the C5 through C7 levels as well as prominent facet arthrosis on the left from C3 through C5. There is a central disc protrusion at C2-3 and contact with the anterior cord and multifactorial canal stenosis greatest at the C6-7 level where it is at least moderate. Upper chest: Negative. Other: 7 mm right thyroid lobe nodule. Moderate calcific atherosclerosis of carotid bifurcations bilaterally. Soft tissue thickening of the posterior commissure of the glottis (series 17, image 59) not clearly present on the motion degraded first acquisition, probably related to phonation. IMPRESSION: 1. Left paramedian parietal scalp soft tissue thickening probably representing a small contusion. No displaced calvarial fracture. 2. No acute intracranial abnormality. 3. Stable mild chronic microvascular ischemic changes of the brain. 4. No acute fracture or dislocation of cervical spine. 5. Cervical spondylosis greatest at C6-7 with there is multifactorial at least moderate canal stenosis. 6. Moderate calcific atherosclerosis of carotid bifurcations bilaterally. Electronically Signed   By: Mitzi Hansen M.D.   On: 03/16/2016 01:27   Dg Chest Portable 1 View  Result Date: 03/16/2016 CLINICAL DATA:  Syncopal episode tonight.  Lethargic. EXAM: PORTABLE CHEST 1 VIEW COMPARISON:  02/13/2016 FINDINGS: Cardiac enlargement without vascular congestion. No edema or consolidation. No blunting of costophrenic angles. No pneumothorax. Calcification of the aorta. IMPRESSION: Cardiac enlargement.  No evidence of active pulmonary disease. Electronically Signed   By: Burman Nieves M.D.   On: 03/16/2016  00:43      Impression / Plan:   Kirk Cortez is a 56 y.o. y/o male with anemia and dark stools. The patient was admitted with syncope.  The patient has a GI bleed that does not appear to have been worked up back in December.  This was at an outside hospital.  The patient will be made nothing by mouth for possible EGD tomorrow.  It is not known whether anesthesiology will put the patient to sleep with a positive cocaine yesterday.  The patient has been explained the plan and agrees with it  Thank you for involving me in the care of this patient.      LOS: 1 day   [name], MD  03/17/2016, 8:30 PM   Note: This dictation was prepared with Dragon dictation along with smaller phrase technology. Any transcriptional errors that result from this process are unintentional.

## 2016-03-18 ENCOUNTER — Inpatient Hospital Stay: Payer: Medicaid Other

## 2016-03-18 ENCOUNTER — Encounter
Admission: EM | Disposition: A | Discharge status: Home / Self Care | Payer: Self-pay | Source: Home / Self Care | Attending: Internal Medicine

## 2016-03-18 DIAGNOSIS — I472 Ventricular tachycardia, unspecified: Secondary | ICD-10-CM

## 2016-03-18 DIAGNOSIS — R778 Other specified abnormalities of plasma proteins: Secondary | ICD-10-CM

## 2016-03-18 DIAGNOSIS — R55 Syncope and collapse: Secondary | ICD-10-CM

## 2016-03-18 DIAGNOSIS — F1410 Cocaine abuse, uncomplicated: Secondary | ICD-10-CM

## 2016-03-18 DIAGNOSIS — I4729 Other ventricular tachycardia: Secondary | ICD-10-CM

## 2016-03-18 DIAGNOSIS — I340 Nonrheumatic mitral (valve) insufficiency: Secondary | ICD-10-CM

## 2016-03-18 DIAGNOSIS — I272 Pulmonary hypertension, unspecified: Secondary | ICD-10-CM

## 2016-03-18 DIAGNOSIS — I071 Rheumatic tricuspid insufficiency: Secondary | ICD-10-CM

## 2016-03-18 DIAGNOSIS — F1010 Alcohol abuse, uncomplicated: Secondary | ICD-10-CM

## 2016-03-18 DIAGNOSIS — E871 Hypo-osmolality and hyponatremia: Secondary | ICD-10-CM

## 2016-03-18 DIAGNOSIS — R7989 Other specified abnormal findings of blood chemistry: Secondary | ICD-10-CM

## 2016-03-18 DIAGNOSIS — I5023 Acute on chronic systolic (congestive) heart failure: Secondary | ICD-10-CM

## 2016-03-18 DIAGNOSIS — Z716 Tobacco abuse counseling: Secondary | ICD-10-CM

## 2016-03-18 LAB — BASIC METABOLIC PANEL
ANION GAP: 8 (ref 5–15)
BUN: 35 mg/dL — AB (ref 6–20)
CALCIUM: 8.9 mg/dL (ref 8.9–10.3)
CO2: 32 mmol/L (ref 22–32)
Chloride: 95 mmol/L — ABNORMAL LOW (ref 101–111)
Creatinine, Ser: 0.92 mg/dL (ref 0.61–1.24)
GFR calc Af Amer: >60 mL/min (ref >60)
Glucose, Bld: 101 mg/dL — ABNORMAL HIGH (ref 65–99)
POTASSIUM: 3.4 mmol/L — AB (ref 3.5–5.1)
SODIUM: 135 mmol/L (ref 135–145)

## 2016-03-18 LAB — CBC
HCT: 30.7 % — ABNORMAL LOW (ref 40.0–52.0)
Hemoglobin: 9.5 g/dL — ABNORMAL LOW (ref 13.0–18.0)
MCH: 23.8 pg — ABNORMAL LOW (ref 26.0–34.0)
MCHC: 30.8 g/dL — ABNORMAL LOW (ref 32.0–36.0)
MCV: 77.1 fL — ABNORMAL LOW (ref 80.0–100.0)
PLATELETS: 405 10*3/uL (ref 150–440)
RBC: 3.98 MIL/uL — AB (ref 4.40–5.90)
RDW: 22.3 % — AB (ref 11.5–14.5)
WBC: 5.5 10*3/uL (ref 3.8–10.6)

## 2016-03-18 LAB — URINE DRUG SCREEN, QUALITATIVE (ARMC ONLY)
Amphetamines, Ur Screen: NOT DETECTED
BARBITURATES, UR SCREEN: NOT DETECTED
BENZODIAZEPINE, UR SCRN: POSITIVE — AB
COCAINE METABOLITE, UR ~~LOC~~: POSITIVE — AB
Cannabinoid 50 Ng, Ur ~~LOC~~: NOT DETECTED
MDMA (Ecstasy)Ur Screen: NOT DETECTED
METHADONE SCREEN, URINE: NOT DETECTED
OPIATE, UR SCREEN: NOT DETECTED
PHENCYCLIDINE (PCP) UR S: NOT DETECTED
Tricyclic, Ur Screen: NOT DETECTED

## 2016-03-18 LAB — MAGNESIUM: Magnesium: 1.8 mg/dL (ref 1.7–2.4)

## 2016-03-18 SURGERY — ESOPHAGOGASTRODUODENOSCOPY (EGD) WITH PROPOFOL
Anesthesia: General

## 2016-03-18 MED ORDER — MAGNESIUM SULFATE 2 GM/50ML IV SOLN
2.0000 g | Freq: Once | INTRAVENOUS | Status: AC
  Administered 2016-03-18: 2 g via INTRAVENOUS
  Filled 2016-03-18: qty 50

## 2016-03-18 MED ORDER — FOLIC ACID 1 MG PO TABS: 1.0000 mg | ORAL_TABLET | Freq: Every day | ORAL | 6 refills | Status: AC

## 2016-03-18 MED ORDER — NICOTINE 14 MG/24HR TD PT24: 14.0000 mg | MEDICATED_PATCH | Freq: Every day | TRANSDERMAL | 0 refills | Status: AC

## 2016-03-18 MED ORDER — POTASSIUM CHLORIDE CRYS ER 20 MEQ PO TBCR
40.0000 meq | EXTENDED_RELEASE_TABLET | Freq: Once | ORAL | Status: AC
  Administered 2016-03-18: 40 meq via ORAL
  Filled 2016-03-18: qty 2

## 2016-03-18 MED ORDER — IPRATROPIUM BROMIDE 0.02 % IN SOLN
0.5000 mg | Freq: Four times a day (QID) | RESPIRATORY_TRACT | Status: DC | PRN
Start: 2016-03-18 — End: 2016-03-18

## 2016-03-18 MED ORDER — IPRATROPIUM BROMIDE 0.02 % IN SOLN: 0.5000 mg | Freq: Four times a day (QID) | RESPIRATORY_TRACT | 12 refills | Status: AC | PRN

## 2016-03-18 MED ORDER — THIAMINE HCL 100 MG PO TABS: 100.0000 mg | ORAL_TABLET | Freq: Every day | ORAL | 6 refills | Status: AC

## 2016-03-18 MED ORDER — DOXYCYCLINE HYCLATE 100 MG PO TABS: 100.0000 mg | ORAL_TABLET | Freq: Two times a day (BID) | ORAL | 0 refills | Status: AC

## 2016-03-18 MED ORDER — ADULT MULTIVITAMIN W/MINERALS CH: 1.0000 | ORAL_TABLET | Freq: Every day | ORAL | 6 refills | Status: AC

## 2016-03-18 NOTE — Progress Notes (Addendum)
Patient had a 5 - beat run of V-tach. Patient is asymptomatic. Text message sent to Dr. Winona LegatoVaickute.  Magnesium 2G IVPB scheduled for 0800, will hang this shortly. Co-signed Normajean GlasgowJancy Johnstone, RN

## 2016-03-18 NOTE — Plan of Care (Signed)
Problem: Health Behavior/Discharge Planning: Goal: Ability to manage health-related needs will improve Outcome: Adequate for Discharge Discussed findings of admission, and the not yet determined source of bleeding.  Reviewed medication, indications and schedule.  Stressed the need to follow this schedule.  Reinforced that he can not have an endoscopy - EGD when he is taking Cocaine.  He has to stop so the procedure can be done.

## 2016-03-18 NOTE — Progress Notes (Signed)
I have reviewed and agree with or have altered the assessment by Dellia NimsErica Morales, Student RN

## 2016-03-18 NOTE — Progress Notes (Addendum)
Patient is being discharged to home with his daughter.  Discharge instructions include the need to stop using cocaine at least until he can complete a workup for his blood loss.  He states he doesn't know how the cocaine got into his system or who is doing this to him.

## 2016-03-18 NOTE — Discharge Summary (Signed)
[facility] [place] [facility]   PATIENT NAME: Kirk DiesDavid Cortez    MR#:  [id number]  DATE OF BIRTH:  [birth year]  DATE OF ADMISSION:  03/15/2016 ADMITTING PHYSICIAN: Kirk AustinPavan Pyreddy, MD  DATE OF DISCHARGE: No discharge date for patient encounter.  PRIMARY CARE PHYSICIAN: Kirk Cortez     ADMISSION DIAGNOSIS:  Substance abuse [F19.10] Seizure (HCC) [R56.9] RUQ pain [R10.11] Atrial fibrillation with rapid ventricular response (HCC) [I48.91] Symptomatic anemia [D64.9] Hypotension, unspecified hypotension type [I95.9] Syncope, unspecified syncope type [R55]  DISCHARGE DIAGNOSIS:  Principal Problem:   Syncope Active Problems:   Seizure (HCC)   Acute on chronic systolic CHF (congestive heart failure) (HCC)   Hypotension   Atrial fibrillation with rapid ventricular response (HCC)   Substance abuse   Symptomatic anemia   Dilated cardiomyopathy (HCC)   Acute post-hemorrhagic anemia   Hyponatremia   Alcohol abuse   Cocaine abuse   Tobacco abuse counseling   Paroxysmal ventricular tachycardia (HCC)   Moderate tricuspid regurgitation   Moderate to severe pulmonary hypertension   Melena   Elevated troponin   Moderate mitral regurgitation   SECONDARY DIAGNOSIS:   Past Medical History:  Diagnosis Date  . Asthma   . Chronic atrial fibrillation (HCC)    a. 2018 Anticoagulation d/c'd in setting of GIB.  Marland Kitchen. Chronic systolic CHF (congestive heart failure) (HCC)    a. 02/2016 Echo: EF 20-25%, sev MR, mod-sev dil LA, RV dysfxn, mild to mod TR, Sev PAH, mod dil RA.  Marland Kitchen. COPD (chronic obstructive pulmonary disease) (HCC)   . GI bleed    a. 01/2016 - Required 7units PRBCs.  . History of bialteral pulmonary emboli   . History of bilateral DVT (deep vein thrombosis)   . Hyperlipidemia   . Hypertension   . Mitral regurgitation    a. 02/2016 Echo: Mod-sev MR.  Marland Kitchen. NICM (nonischemic cardiomyopathy) (HCC)    a. 02/2016 Echo: EF 20-25%.  . Non-obstructive CAD  (coronary artery disease)    a. 07/2011 PTE CT: EF 22%, Cor Ca2+ involving LAD, RCA, LCX, moderate apical, basal ant, basal antlat, basal antsept, mid ant, mid antlat, and mid antsept ischemia;  b. 07/2011 Cath: LAD 60ost, 70.  . Polysubstance abuse    a. 40 pack year h/o tobacco abuse; b. uses cocaine and buys opiates "off the street" - says recently he's been using suboxone.    .pro HOSPITAL COURSE:  The patient is 56 year old Caucasian male with past medical history significant for history of pulmonary hypertension, coronary artery disease, cardiomyopathy with ejection fraction of 20-25% , hypertension, atrial fibrillation, pulmonary embolism, gastrointestinal bleed, polysubstance abuse, who presents to the hospital with complaints of syncopal episode. He was noted to have low blood pressure and his hemoglobin level was found to be low, lactate level was also found to be increased. Patient was transfused packed red blood cells, and admitted, although he denied any active bleeding. While during radiologic evaluation, patient had seizure episode. The patient was evaluated by cardiologist, gastroenterologist. Because of cocaine and drug screen, no endoscopic evaluations including EGD or colonoscopy were performed. Patient was initiated on clear liquid diet, which was advanced to full liquids. Patient was advised to follow up with gastroenterologist, Dr. Servando SnareWohl as outpatient to complete these studies as long as his urine drug screen is negative for cocaine. This conservative therapy, blood transfusion, the patient clinically improved and he was felt to be stable to be discharged home Discussion by problem: #1. Syncopal episode, Suspected hypotension  related, most recent echocardiogram done at [facility] was January 2018, revealing ejection fraction of 20-25%, severe mitral regurgitation, moderate to severe dilated left atrium, aortic sclerosis, reduced right ventricular systolic function, severely elevated pulmonary  systolic pressure, echocardiogram was repeated during this admission, systolic function was moderately to severely reduced. The estimated ejection fraction was in the range of 30% to 35%. Diffuse hypokinesis of the anterior myocardium. Carotid ultrasound  results are pending. Patient was seen by cardiologist, who recommended conservative therapy.  #2. Seizure episode, suspected related to hypotension/hypoxia,  electroencephalogram, brain MRI were performed and they were found to be normal. No recurrences of seizures were noted, patient was not initiated on antiseizure medications #3. Acute on chronic systolic CHF, continue Lasix , Zaroxolyn, according to prior recommendations, the patient diuresed about 3.2 L during his stay in the hospital time  #4. Acute on chronic renal failure, resolved with conservative therapy, creatinine level was 0.92 on the day of discharge, with estimated GFR of more than 60 #5. Elevated troponin, likely demand ischemia, repeated troponin was normal, cardiac consultation is appreciated, no interventions were recommended. Patient is to continue metoprolol, aspirin, atorvastatin #6. Severe anemia, status post transfusion, hemoglobin level has improved and remains stable, no active bleeding was noted #7. Hyponatremia in fluid overloaded Patient, resolved  with diuresis #8 A. fib atrial RVR, patient was given digoxin while in the hospital due to hypotension, he is to resume Toprol, appreciate cardiologist input , heart rate is fluctuating, however, patient's urine drug screen is positive for cocaine #9. Suspected gastrointestinal bleed, no EGD or colonoscopy were performed during prior admission at [facility] and during his stay at [facility] due to urinary drug screen positive for cocaine, patient was advised to abstain from cocaine and the follow-up was gastroenterologist, Dr. Servando Snare as outpatient,  no active bleeding was noted, continue PPI twice a day. Continue full liquid  diet, to be slowly advanced to regular.  #10. Alcohol abuse, no alcohol withdrawal was noted  #11. Tobacco abuse. Counseling, discussed this patient for approximately 4 minutes, nicotine replacement therapy is initiated #12. Discharge planning. Home health services will be arranged including nurse and social worker to visit as outpatient  DISCHARGE CONDITIONS:   Stable  CONSULTS OBTAINED:  Treatment Team:  Yvonne Kendall, MD [name], MD  DRUG ALLERGIES:  No Known Allergies  DISCHARGE MEDICATIONS:   Current Discharge Medication List    START taking these medications   Details  doxycycline (VIBRA-TABS) 100 MG tablet Take 1 tablet (100 mg total) by mouth every 12 (twelve) hours. Qty: 14 tablet, Refills: 0    folic acid (FOLVITE) 1 MG tablet Take 1 tablet (1 mg total) by mouth daily. Qty: 30 tablet, Refills: 6    ipratropium (ATROVENT) 0.02 % nebulizer solution Take 2.5 mLs (0.5 mg total) by nebulization every 6 (six) hours as needed for wheezing or shortness of breath. Qty: 75 mL, Refills: 12    Multiple Vitamin (MULTIVITAMIN WITH MINERALS) TABS tablet Take 1 tablet by mouth daily. Qty: 30 tablet, Refills: 6    nicotine (NICODERM CQ - DOSED IN MG/24 HOURS) 14 mg/24hr patch Place 1 patch (14 mg total) onto the skin daily. Qty: 28 patch, Refills: 0    thiamine 100 MG tablet Take 1 tablet (100 mg total) by mouth daily. Qty: 30 tablet, Refills: 6      CONTINUE these medications which have NOT CHANGED   Details  albuterol (ACCUNEB) 1.25 MG/3ML nebulizer solution Take 1.25 mg by nebulization every 6 (  six) hours as needed.    atorvastatin (LIPITOR) 80 MG tablet Take 80 mg by mouth every evening.    furosemide (LASIX) 80 MG tablet Take 80 mg by mouth 2 (two) times daily.    ipratropium (ATROVENT HFA) 17 MCG/ACT inhaler Inhale 2 puffs into the lungs every 6 (six) hours.    lisinopril (PRINIVIL,ZESTRIL) 2.5 MG tablet Take 2.5 mg by mouth daily.    magnesium oxide  (MAG-OX) 400 MG tablet Take 400 mg by mouth 2 (two) times daily.    metoprolol succinate (TOPROL-XL) 25 MG 24 hr tablet Take 12.5 mg by mouth daily.    pantoprazole (PROTONIX) 40 MG tablet Take 40 mg by mouth 2 (two) times daily.    sertraline (ZOLOFT) 50 MG tablet Take 50 mg by mouth daily.    tiotropium (SPIRIVA) 18 MCG inhalation capsule Place 18 mcg into inhaler and inhale daily.    metolazone (ZAROXOLYN) 2.5 MG tablet Take 2.5 mg by mouth daily.      STOP taking these medications     aspirin EC 81 MG tablet          DISCHARGE INSTRUCTIONS:    The patient is to follow-up with primary care physician, primary cardiologist, gastroenterologist Dr. Servando Snare for HDL and colonoscopy as above. His urine drug screen is negative for cocaine for at least for 4 days  If you experience worsening of your admission symptoms, develop shortness of breath, life threatening emergency, suicidal or homicidal thoughts you must seek medical attention immediately by calling 911 or calling your MD immediately  if symptoms less severe.  You Must read complete instructions/literature along with all the possible adverse reactions/side effects for all the Medicines you take and that have been prescribed to you. Take any new Medicines after you have completely understood and accept all the possible adverse reactions/side effects.   Please note  You were cared for by a hospitalist during your hospital stay. If you have any questions about your discharge medications or the care you received while you were in the hospital after you are discharged, you can call the unit and asked to speak with the hospitalist on call if the hospitalist that took care of you is not available. Once you are discharged, your primary care physician will handle any further medical issues. Please note that NO REFILLS for any discharge medications will be authorized once you are discharged, as it is imperative that you return to your primary  care physician (or establish a relationship with a primary care physician if you do not have one) for your aftercare needs so that they can reassess your need for medications and monitor your lab values.    Today   CHIEF COMPLAINT:   Chief Complaint  Patient presents with  . Loss of Consciousness    HISTORY OF PRESENT ILLNESS:  Kirk Cortez  is a 56 y.o. male with a known history of pulmonary hypertension, coronary artery disease, cardiomyopathy with ejection fraction of 20-25% , hypertension, atrial fibrillation, pulmonary embolism, gastrointestinal bleed, polysubstance abuse, who presents to the hospital with complaints of syncopal episode. He was noted to have low blood pressure and his hemoglobin level was found to be low, lactate level was also found to be increased. Patient was transfused packed red blood cells, and admitted, although he denied any active bleeding. While during radiologic evaluation, patient had seizure episode. The patient was evaluated by cardiologist, gastroenterologist. Because of cocaine and drug screen, no endoscopic evaluations including EGD or colonoscopy were performed.  Patient was initiated on clear liquid diet, which was advanced to full liquids. Patient was advised to follow up with gastroenterologist, Dr. Servando Snare as outpatient to complete these studies as long as his urine drug screen is negative for cocaine. This conservative therapy, blood transfusion, the patient clinically improved and he was felt to be stable to be discharged home Discussion by problem: #1. Syncopal episode, Suspected hypotension related, most recent echocardiogram done at [facility] was January 2018, revealing ejection fraction of 20-25%, severe mitral regurgitation, moderate to severe dilated left atrium, aortic sclerosis, reduced right ventricular systolic function, severely elevated pulmonary systolic pressure, echocardiogram was repeated during this admission, systolic function was moderately to  severely reduced. The estimated ejection fraction was in the range of 30% to 35%. Diffuse hypokinesis of the anterior myocardium. Carotid ultrasound  results are pending. Patient was seen by cardiologist, who recommended conservative therapy.  #2. Seizure episode, suspected related to hypotension/hypoxia,  electroencephalogram, brain MRI were performed and they were found to be normal. No recurrences of seizures were noted, patient was not initiated on antiseizure medications #3. Acute on chronic systolic CHF, continue Lasix , Zaroxolyn, according to prior recommendations, the patient diuresed about 3.2 L during his stay in the hospital time  #4. Acute on chronic renal failure, resolved with conservative therapy, creatinine level was 0.92 on the day of discharge, with estimated GFR of more than 60 #5. Elevated troponin, likely demand ischemia, repeated troponin was normal, cardiac consultation is appreciated, no interventions were recommended. Patient is to continue metoprolol, aspirin, atorvastatin #6. Severe anemia, status post transfusion, hemoglobin level has improved and remains stable, no active bleeding was noted #7. Hyponatremia in fluid overloaded Patient, resolved  with diuresis #8 A. fib atrial RVR, patient was given digoxin while in the hospital due to hypotension, he is to resume Toprol, appreciate cardiologist input , heart rate is fluctuating, however, patient's urine drug screen is positive for cocaine #9. Suspected gastrointestinal bleed, no EGD or colonoscopy were performed during prior admission at [facility] and during his stay at [facility] due to urinary drug screen positive for cocaine, patient was advised to abstain from cocaine and the follow-up was gastroenterologist, Dr. Servando Snare as outpatient,  no active bleeding was noted, continue PPI twice a day. Continue full liquid diet, to be slowly advanced to regular.  #10. Alcohol abuse, no alcohol withdrawal was noted  #11.  Tobacco abuse. Counseling, discussed this patient for approximately 4 minutes, nicotine replacement therapy is initiated #12. Discharge planning. Home health services will be arranged including nurse and social worker to visit as outpatient  VITAL SIGNS:  Blood pressure 119/86, pulse (!) 114, temperature 98.2 F (36.8 C), temperature source Oral, resp. rate 20, height 5\' 10"  (1.778 m), weight 101.8 kg (224 lb 6.9 oz), SpO2 90 %.  I/O:    Intake/Output Summary (Last 24 hours) at 03/18/16 1621 Last data filed at 03/18/16 0911  Gross per 24 hour  Intake              480 ml  Output             2475 ml  Net            -1995 ml    PHYSICAL EXAMINATION:  GENERAL:  56 y.o.-year-old patient lying in the bed with no acute distress.  EYES: Pupils equal, round, reactive to light and accommodation. No scleral icterus. Extraocular muscles intact.  HEENT: Head atraumatic, normocephalic. Oropharynx and nasopharynx clear.  NECK:  Supple, no  jugular venous distention. No thyroid enlargement, no tenderness.  LUNGS: Normal breath sounds bilaterally, no wheezing, rales,rhonchi or crepitation. No use of accessory muscles of respiration.  CARDIOVASCULAR: S1, S2 , irregularly irregular, . No murmurs, rubs, or gallops.  ABDOMEN: Soft, non-tender, non-distended. Bowel sounds present. No organomegaly or mass.  EXTREMITIES: No pedal edema, cyanosis, or clubbing.  NEUROLOGIC: Cranial nerves II through XII are intact. Muscle strength 5/5 in all extremities. Sensation intact. Gait not checked.  PSYCHIATRIC: The patient is alert and oriented x 3.  SKIN: No obvious rash, lesion, or ulcer.   DATA REVIEW:   CBC  Recent Labs Lab 03/18/16 0749  WBC 5.5  HGB 9.5*  HCT 30.7*  PLT 405    Chemistries   Recent Labs Lab 03/17/16 0405 03/18/16 0749  NA 134* 135  K 4.1 3.4*  CL 101 95*  CO2 26 32  GLUCOSE 98 101*  BUN 60* 35*  CREATININE 1.21 0.92  CALCIUM 8.9 8.9  MG  --  1.8  AST 18  --   ALT 28  --    ALKPHOS 92  --   BILITOT 1.1  --     Cardiac Enzymes  Recent Labs Lab 03/17/16 1048  TROPONINI <0.03    Microbiology Results  Results for orders placed or performed during the hospital encounter of 03/15/16  MRSA PCR Screening     Status: None   Collection Time: 03/16/16  9:57 PM  Result Value Ref Range Status   MRSA by PCR NEGATIVE NEGATIVE Final    Comment:        The GeneXpert MRSA Assay (FDA approved for NASAL specimens only), is one component of a comprehensive MRSA colonization surveillance program. It is not intended to diagnose MRSA infection nor to guide or monitor treatment for MRSA infections.   Culture, expectorated sputum-assessment     Status: None   Collection Time: 03/17/16 12:35 PM  Result Value Ref Range Status   Specimen Description Expect. Sput  Final   Special Requests NONE  Final   Sputum evaluation THIS SPECIMEN IS ACCEPTABLE FOR SPUTUM CULTURE  Final   Report Status 03/17/2016 FINAL  Final  Culture, respiratory (NON-Expectorated)     Status: None (Preliminary result)   Collection Time: 03/17/16 12:35 PM  Result Value Ref Range Status   Specimen Description EXPECTORATED SPUTUM  Final   Special Requests NONE Reflexed from [id number]  Final   Gram Stain   Final    MODERATE WBC PRESENT,BOTH PMN AND MONONUCLEAR FEW GRAM POSITIVE COCCI IN PAIRS FEW GRAM VARIABLE ROD    Culture   Final    TOO YOUNG TO READ Performed at [facility], [address], [place], [place] [address]    Report Status PENDING  Incomplete    RADIOLOGY:  Ct Abdomen Pelvis Wo Contrast  Result Date: 03/16/2016 CLINICAL DATA:  Right upper quadrant pain. Congestive heart failure. COPD. Syncope. Decreased hemoglobin. EXAM: CT ABDOMEN AND PELVIS WITHOUT CONTRAST TECHNIQUE: Multidetector CT imaging of the abdomen and pelvis was performed following the standard protocol without IV contrast. COMPARISON:  None. FINDINGS: Lower chest: Right base airspace disease with air  bronchograms. Surrounding reticulonodular opacity. There is subtle left lower lobe reticulonodular opacity well. Moderate cardiomegaly with right coronary artery atherosclerosis. Trace right pleural fluid. Hepatobiliary: Hepatomegaly at 19.9 cm craniocaudal. Normal gallbladder, without biliary ductal dilatation. Pancreas: Normal, without mass or ductal dilatation. Spleen: Normal in size, without focal abnormality. Adrenals/Urinary Tract: Mild low density right adrenal nodularity, likely due  to an adenoma at 1.6 cm. Normal left adrenal gland. Bilateral punctate renal collecting system calculi, without hydronephrosis or hydroureter. No bladder calculi. Stomach/Bowel: Normal stomach, without wall thickening. Normal colon and terminal ileum. Normal small bowel. Vascular/Lymphatic: Aortic and branch vessel atherosclerosis. No abdominopelvic adenopathy. Reproductive: Normal prostate. Other: Small volume cul-de-sac fluid and perihepatic ascites. No free intraperitoneal air. Bilateral inguinal hernia repair. Tiny fat containing ventral abdominal wall hernia. Musculoskeletal: Thoracolumbar spondylosis. IMPRESSION: 1. Right lower lobe airspace disease is suspicious for pneumonia. Right greater than left reticulonodular opacities are also likely infectious. 2. Small right pleural effusion. 3. Bilateral nephrolithiasis.  No obstructive uropathy. 4. Small volume abdominopelvic ascites. 5.  Coronary artery atherosclerosis. Aortic atherosclerosis. Electronically Signed   By: Jeronimo Greaves M.D.   On: 03/16/2016 17:28   Mr Brain Wo Contrast  Result Date: 03/18/2016 CLINICAL DATA:  Seizure EXAM: MRI HEAD WITHOUT CONTRAST TECHNIQUE: Multiplanar, multiecho pulse sequences of the brain and surrounding structures were obtained without intravenous contrast. COMPARISON:  Two days ago FINDINGS: Brain: No acute infarction, hemorrhage, hydrocephalus, extra-axial collection or mass lesion. No cortical finding to explain seizure. Symmetric  normal hippocampal formations. Rare FLAIR hyperintensities in the cerebral white matter, age congruent. Normal cerebral volume. Vascular: Preserved flow voids Skull and upper cervical spine: Negative Sinuses/Orbits: Negative IMPRESSION: No acute finding.  No cortical finding to explain seizure. Electronically Signed   By: Marnee Spring M.D.   On: 03/18/2016 12:20    EKG:   Orders placed or performed during the hospital encounter of 03/15/16  . ED EKG  . ED EKG  . EKG 12-Lead  . EKG 12-Lead      Management plans discussed with the patient, family and they are in agreement.  CODE STATUS:     Code Status Orders        Start     Ordered   03/16/16 2157  Full code  Continuous     03/16/16 2157    Code Status History    Date Active Date Inactive Code Status Order ID Comments User Context   09/16/2015  9:28 PM 09/19/2015 11:59 AM Full Code [id number]  Houston Siren, MD Inpatient   09/15/2015  5:51 AM 09/15/2015 12:06 PM Full Code [id number]  Kirk Austin, MD Inpatient      TOTAL TIME TAKING CARE OF THIS PATIENT: 40 minutes.    Katharina Caper M.D on 03/18/2016 at 4:21 PM  Between 7am to 6pm - Pager - [phone]  After 6pm go to [email or web address] - password [email or web address] [facility]  [place]  [facility]  Office  [phone]  CC: Primary care physician; Kirk Real Cortez

## 2016-03-18 NOTE — Care Management (Addendum)
Informed patient to discharge home today.  Attending has ordered home health nursing.  [place] says there are squatters in his house so he will be going to [address] [place] at [place].  Discussed heart rate with attending and at baseline patient has elevated heart rate due to cocaine and low EF.  [place] [facility] not staff the case.  [facility] called to [facility]. There is concern that compliance and follow through on patient's part will be problem based on past history.  Patient confirms his phone number and that it is working.  Says [facility] was [facility] month

## 2016-03-18 NOTE — Progress Notes (Signed)
PT Cancellation Note  Patient Details Name: Kirk DiesDavid Vanroekel MRN: [id number] DOB: [birth year]   Cancelled Treatment:    Reason Eval/Treat Not Completed: Medical issues which prohibited therapy. PT checked vitals in orthostatics, patient's HR noted to escalate as high as 157 pre-ambulation. Systolic pressure dropped from 120 in supine to 109 in standing. Given his history of a-fib and cardiac response, will hold mobility evaluation until HR is not as elevated putting him at risk of syncopal episode.    Alva GarnetPatrick Tauno Falotico PT, DPT, CSCS    03/18/2016, 2:00 PM

## 2016-03-18 NOTE — Discharge Instructions (Signed)
You have to stop using cocaine so that the gastric procedure can be done to determine if you are bleeding from your stomach.

## 2016-03-18 NOTE — Progress Notes (Addendum)
    Patient down for EEG, carotid doppler, and MRI brain. Will check back later.

## 2016-03-18 NOTE — Progress Notes (Signed)
All evening meds given now.  Due to his drug abuse history.  I can't be sure if he will comply with his medications this evening.

## 2016-03-20 LAB — CULTURE, RESPIRATORY

## 2016-03-20 LAB — CULTURE, RESPIRATORY W GRAM STAIN: Culture: NORMAL

## 2016-03-22 NOTE — Care Management (Signed)
Kirk DiesDavid Cortez DOB: 1961/01/02 Visit Made by Kirk PalmAmy Cortez patient declined services stating he only wants to follow up with his own MD.  Not admitted to services due to refusing care.  This was not unexpected

## 2016-03-22 NOTE — Progress Notes (Signed)
[facility]  Patient Status: Not taken under care, patient declined services stating he only wants to follow up with his own MD. Notified Ermalene SearingNann Greene, CM & Collie SiadAngela Johnson, CM    Kirk CaseyJason E Cortez 03/22/2016, 9:43 AM

## 2016-03-23 ENCOUNTER — Other Ambulatory Visit: Payer: Self-pay

## 2016-03-23 ENCOUNTER — Ambulatory Visit: Payer: Self-pay | Admitting: Gastroenterology

## 2016-05-11 ENCOUNTER — Ambulatory Visit: Payer: Disability Insurance | Admitting: Family

## 2016-05-11 ENCOUNTER — Telehealth: Payer: Self-pay | Admitting: Family

## 2016-05-11 NOTE — Telephone Encounter (Signed)
Patient missed his initial appointment [facility] on [facility]11/16. Will attempt to reschedule.

## 2016-05-26 ENCOUNTER — Emergency Department
Admission: EM | Admit: 2016-05-26 | Discharge: 2016-05-26 | Discharge status: Against Medical Advice | Payer: Medicaid Other | Attending: Emergency Medicine | Admitting: Emergency Medicine

## 2016-05-26 ENCOUNTER — Encounter: Payer: Self-pay | Admitting: Emergency Medicine

## 2016-05-26 ENCOUNTER — Emergency Department: Payer: Medicaid Other

## 2016-05-26 DIAGNOSIS — J45909 Unspecified asthma, uncomplicated: Secondary | ICD-10-CM | POA: Insufficient documentation

## 2016-05-26 DIAGNOSIS — R079 Chest pain, unspecified: Secondary | ICD-10-CM

## 2016-05-26 DIAGNOSIS — F1193 Opioid use, unspecified with withdrawal: Secondary | ICD-10-CM

## 2016-05-26 DIAGNOSIS — J441 Chronic obstructive pulmonary disease with (acute) exacerbation: Secondary | ICD-10-CM | POA: Insufficient documentation

## 2016-05-26 DIAGNOSIS — F1123 Opioid dependence with withdrawal: Secondary | ICD-10-CM | POA: Insufficient documentation

## 2016-05-26 DIAGNOSIS — Z79899 Other long term (current) drug therapy: Secondary | ICD-10-CM | POA: Insufficient documentation

## 2016-05-26 DIAGNOSIS — I5022 Chronic systolic (congestive) heart failure: Secondary | ICD-10-CM | POA: Diagnosis not present

## 2016-05-26 DIAGNOSIS — I110 Hypertensive heart disease with heart failure: Secondary | ICD-10-CM | POA: Diagnosis not present

## 2016-05-26 DIAGNOSIS — R0602 Shortness of breath: Secondary | ICD-10-CM | POA: Diagnosis present

## 2016-05-26 DIAGNOSIS — F17200 Nicotine dependence, unspecified, uncomplicated: Secondary | ICD-10-CM | POA: Insufficient documentation

## 2016-05-26 LAB — URINE DRUG SCREEN, QUALITATIVE (ARMC ONLY)
Amphetamines, Ur Screen: NOT DETECTED
Barbiturates, Ur Screen: NOT DETECTED
Benzodiazepine, Ur Scrn: NOT DETECTED
CANNABINOID 50 NG, UR ~~LOC~~: NOT DETECTED
COCAINE METABOLITE, UR ~~LOC~~: NOT DETECTED
MDMA (ECSTASY) UR SCREEN: NOT DETECTED
Methadone Scn, Ur: NOT DETECTED
OPIATE, UR SCREEN: NOT DETECTED
PHENCYCLIDINE (PCP) UR S: NOT DETECTED
Tricyclic, Ur Screen: NOT DETECTED

## 2016-05-26 LAB — CBC
HEMATOCRIT: 33.7 % — AB (ref 40.0–52.0)
HEMOGLOBIN: 10 g/dL — AB (ref 13.0–18.0)
MCH: 21 pg — AB (ref 26.0–34.0)
MCHC: 29.7 g/dL — AB (ref 32.0–36.0)
MCV: 70.8 fL — AB (ref 80.0–100.0)
Platelets: 251 10*3/uL (ref 150–440)
RBC: 4.76 MIL/uL (ref 4.40–5.90)
RDW: 22.7 % — AB (ref 11.5–14.5)
WBC: 7.1 10*3/uL (ref 3.8–10.6)

## 2016-05-26 LAB — BASIC METABOLIC PANEL
ANION GAP: 11 (ref 5–15)
BUN: 75 mg/dL — AB (ref 6–20)
CO2: 28 mmol/L (ref 22–32)
Calcium: 9.1 mg/dL (ref 8.9–10.3)
Chloride: 94 mmol/L — ABNORMAL LOW (ref 101–111)
Creatinine, Ser: 1.47 mg/dL — ABNORMAL HIGH (ref 0.61–1.24)
GFR calc Af Amer: 60 mL/min — ABNORMAL LOW (ref >60)
GFR calc non Af Amer: 52 mL/min — ABNORMAL LOW (ref >60)
GLUCOSE: 118 mg/dL — AB (ref 65–99)
POTASSIUM: 3.7 mmol/L (ref 3.5–5.1)
Sodium: 133 mmol/L — ABNORMAL LOW (ref 135–145)

## 2016-05-26 LAB — TROPONIN I: Troponin I: 0.04 ng/mL (ref <0.03)

## 2016-05-26 LAB — BRAIN NATRIURETIC PEPTIDE: B NATRIURETIC PEPTIDE 5: 1693 pg/mL — AB (ref 0.0–100.0)

## 2016-05-26 MED ORDER — DIPHENHYDRAMINE HCL 50 MG/ML IJ SOLN
12.5000 mg | INTRAMUSCULAR | Status: AC
  Administered 2016-05-26: 12.5 mg via INTRAVENOUS

## 2016-05-26 MED ORDER — PREDNISONE 20 MG PO TABS: 60.0000 mg | ORAL_TABLET | Freq: Every day | ORAL | 0 refills | Status: AC

## 2016-05-26 MED ORDER — DIPHENHYDRAMINE HCL 50 MG/ML IJ SOLN
INTRAMUSCULAR | Status: AC
  Administered 2016-05-26: 12.5 mg via INTRAVENOUS
  Filled 2016-05-26: qty 1

## 2016-05-26 MED ORDER — IPRATROPIUM-ALBUTEROL 0.5-2.5 (3) MG/3ML IN SOLN
3.0000 mL | Freq: Once | RESPIRATORY_TRACT | Status: AC
  Administered 2016-05-26: 3 mL via RESPIRATORY_TRACT

## 2016-05-26 MED ORDER — HALOPERIDOL LACTATE 5 MG/ML IJ SOLN
INTRAMUSCULAR | Status: AC
  Administered 2016-05-26: 2.5 mg via INTRAVENOUS
  Filled 2016-05-26: qty 1

## 2016-05-26 MED ORDER — ONDANSETRON HCL 4 MG/2ML IJ SOLN
4.0000 mg | Freq: Once | INTRAMUSCULAR | Status: AC
  Administered 2016-05-26: 4 mg via INTRAVENOUS
  Filled 2016-05-26: qty 2

## 2016-05-26 MED ORDER — IPRATROPIUM-ALBUTEROL 0.5-2.5 (3) MG/3ML IN SOLN
RESPIRATORY_TRACT | Status: AC
  Administered 2016-05-26: 3 mL via RESPIRATORY_TRACT
  Filled 2016-05-26: qty 6

## 2016-05-26 MED ORDER — HALOPERIDOL LACTATE 5 MG/ML IJ SOLN
2.5000 mg | Freq: Once | INTRAMUSCULAR | Status: AC
  Administered 2016-05-26: 2.5 mg via INTRAVENOUS

## 2016-05-26 MED ORDER — METHYLPREDNISOLONE SODIUM SUCC 125 MG IJ SOLR
INTRAMUSCULAR | Status: AC
  Filled 2016-05-26: qty 2

## 2016-05-26 NOTE — ED Provider Notes (Signed)
[facility] Emergency Department Provider Note  ____________________________________________   First MD Initiated Contact with Patient 05/26/16 [phone]     (approximate)  I have reviewed the triage vital signs and the nursing notes.   HISTORY  Chief Complaint Chest Pain and Shortness of Breath    HPI Kirk Cortez is a 56 y.o. male with an extensive past medical history including both COPD and CHF as well as chronic atrial fibrillation, CAD, and polysubstance abuse (opioids and cocaine) who presents for evaluation of chest pain, shortness of breath, and weakness.  He reports the symptoms of a gradually worsening for 4 days.  He states that he has not used cocaine for weeks if not a month.  He is trying to stop taking opioids but he has not yet completely successful.  He states that he uses his nebulizers and inhalers at home but it is not helping his shortness of breath.  Scribe's the chest pain as mostly left-sided, sharp, and worse when he is anxious.  He states that he has been under a lot of stress recently due to some problems with squatters on his property and some issues with his own home and family.  He feels like he has not been able to get a good night's rest for days.  He feels like the stresses to fly making his symptoms worse and nothing will make them better.  Past Medical History:  Diagnosis Date  . Asthma   . Chronic atrial fibrillation (HCC)    a. 2018 Anticoagulation d/c'd in setting of GIB.  Marland Kitchen Chronic systolic CHF (congestive heart failure) (HCC)    a. 02/2016 Echo: EF 20-25%, sev MR, mod-sev dil LA, RV dysfxn, mild to mod TR, Sev PAH, mod dil RA.  Marland Kitchen COPD (chronic obstructive pulmonary disease) (HCC)   . GI bleed    a. 01/2016 - Required 7units PRBCs.  . History of bialteral pulmonary emboli   . History of bilateral DVT (deep vein thrombosis)   . Hyperlipidemia   . Hypertension   . Mitral regurgitation    a. 02/2016 Echo: Mod-sev MR.  Marland Kitchen NICM  (nonischemic cardiomyopathy) (HCC)    a. 02/2016 Echo: EF 20-25%.  . Non-obstructive CAD (coronary artery disease)    a. 07/2011 PTE CT: EF 22%, Cor Ca2+ involving LAD, RCA, LCX, moderate apical, basal ant, basal antlat, basal antsept, mid ant, mid antlat, and mid antsept ischemia;  b. 07/2011 Cath: LAD 60ost, 70.  . Polysubstance abuse    a. 40 pack year h/o tobacco abuse; b. uses cocaine and buys opiates "off the street" - says recently he's been using suboxone.    Patient Active Problem List   Diagnosis Date Noted  . Acute on chronic systolic CHF (congestive heart failure) (HCC) 03/18/2016  . Elevated troponin 03/18/2016  . Hyponatremia 03/18/2016  . Alcohol abuse 03/18/2016  . Cocaine abuse 03/18/2016  . Tobacco abuse counseling 03/18/2016  . Paroxysmal ventricular tachycardia (HCC) 03/18/2016  . Moderate mitral regurgitation 03/18/2016  . Moderate tricuspid regurgitation 03/18/2016  . Moderate to severe pulmonary hypertension (HCC) 03/18/2016  . Atrial fibrillation with rapid ventricular response (HCC)   . Seizure (HCC)   . Substance abuse   . Symptomatic anemia   . Dilated cardiomyopathy (HCC)   . Syncope   . Melena   . Acute post-hemorrhagic anemia   . Hypotension 03/16/2016  . Pneumonia of right lower lobe due to infectious organism (HCC) 02/01/2016  . History of pulmonary embolism 01/30/2016  .  Acute encephalopathy 01/14/2016  . Opiate dependence (HCC) 09/17/2015  . 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 Acute respiratory failure with hypoxia (HCC) 09/16/2015  . Community acquired pneumonia 09/15/2015  . CAP (community acquired pneumonia) 09/15/2015  . CHF (congestive heart failure) (HCC) 08/16/2015  . Bilateral pulmonary embolism (HCC) 06/12/2015  . DVT, bilateral lower limbs (HCC) 06/12/2015  . Mixed anxiety depressive disorder 01/14/2015  . Chronic obstructive pulmonary disease (HCC) 04/29/2013  . Low back pain 04/29/2013  . Shortness of breath 04/10/2013  .  Coronary atherosclerosis of native coronary artery 10/06/2011  . Essential hypertension 10/06/2011  . Hyperlipidemia 10/06/2011  . Tobacco use disorder 08/05/2011  . Chest pain 08/04/2011    Past Surgical History:  Procedure Laterality Date  . APPENDECTOMY    . HERNIA REPAIR      Prior to Admission medications   Medication Sig Start Date End Date Taking? Authorizing Provider  albuterol (ACCUNEB) 1.25 MG/3ML nebulizer solution Take 1.25 mg by nebulization every 6 (six) hours as needed. 08/06/15 08/05/16  Historical Provider, MD  atorvastatin (LIPITOR) 80 MG tablet Take 80 mg by mouth every evening. 08/06/15   Historical Provider, MD  doxycycline (VIBRA-TABS) 100 MG tablet Take 1 tablet (100 mg total) by mouth every 12 (twelve) hours. 03/18/16   Katharina Caper, MD  folic acid (FOLVITE) 1 MG tablet Take 1 tablet (1 mg total) by mouth daily. 03/19/16   Katharina Caper, MD  furosemide (LASIX) 80 MG tablet Take 80 mg by mouth 2 (two) times daily.    Historical Provider, MD  ipratropium (ATROVENT HFA) 17 MCG/ACT inhaler Inhale 2 puffs into the lungs every 6 (six) hours.    Historical Provider, MD  ipratropium (ATROVENT) 0.02 % nebulizer solution Take 2.5 mLs (0.5 mg total) by nebulization every 6 (six) hours as needed for wheezing or shortness of breath. 03/18/16   Katharina Caper, MD  lisinopril (PRINIVIL,ZESTRIL) 2.5 MG tablet Take 2.5 mg by mouth daily.    Historical Provider, MD  magnesium oxide (MAG-OX) 400 MG tablet Take 400 mg by mouth 2 (two) times daily. 07/02/15 07/01/16  Historical Provider, MD  metolazone (ZAROXOLYN) 2.5 MG tablet Take 2.5 mg by mouth daily.    Historical Provider, MD  metoprolol succinate (TOPROL-XL) 25 MG 24 hr tablet Take 12.5 mg by mouth daily.    Historical Provider, MD  Multiple Vitamin (MULTIVITAMIN WITH MINERALS) TABS tablet Take 1 tablet by mouth daily. 03/19/16   Katharina Caper, MD  nicotine (NICODERM CQ - DOSED IN MG/24 HOURS) 14 mg/24hr patch Place 1 patch (14 mg total)  onto the skin daily. 03/19/16   Katharina Caper, MD  nitroGLYCERIN (NITROSTAT) 0.4 MG SL tablet Place 0.4 mg under the tongue.    Historical Provider, MD  pantoprazole (PROTONIX) 40 MG tablet Take 40 mg by mouth 2 (two) times daily.    Historical Provider, MD  predniSONE (DELTASONE) 20 MG tablet Take 3 tablets (60 mg total) by mouth daily. 05/26/16   Loleta Rose, MD  sertraline (ZOLOFT) 50 MG tablet Take 50 mg by mouth daily.    Historical Provider, MD  thiamine 100 MG tablet Take 1 tablet (100 mg total) by mouth daily. 03/19/16   Katharina Caper, MD  tiotropium (SPIRIVA) 18 MCG inhalation capsule Place 18 mcg into inhaler and inhale daily.    Historical Provider, MD    Allergies Patient has no known allergies.  Family History  Problem Relation Age of Onset  . Cancer Mother   . Heart attack Father  Social History Social History  Substance Use Topics  . Smoking status: Current Every Day Smoker    Packs/day: 1.00    Years: 40.00  . Smokeless tobacco: Never Used  . Alcohol use Yes     Comment: "3 drinks/week"    Review of Systems Constitutional: No fever/chills Eyes: No visual changes. ENT: No sore throat. Cardiovascular: +chest pain. Respiratory: +shortness of breath. Gastrointestinal: No abdominal pain.  No nausea, no vomiting.  No diarrhea.  No constipation. Genitourinary: Negative for dysuria. Musculoskeletal: Negative for back pain. Skin: Negative for rash. Neurological: Negative for headaches, focal weakness or numbness.  10-point ROS otherwise negative.  ____________________________________________   PHYSICAL EXAM:  VITAL SIGNS: ED Triage Vitals  Enc Vitals Group     BP 05/26/16 0030 94/64     Pulse Rate 05/26/16 0030 98     Resp 05/26/16 0030 20     Temp --      Temp src --      SpO2 05/26/16 0010 96 %     Weight 05/26/16 0011 224 lb (101.6 kg)     Height --      Head Circumference --      Peak Flow --      Pain Score --      Pain Loc --      Pain Edu?  --      Excl. in GC? --     Constitutional: Alert and oriented. Mild respiratory distress but mostly anxious and fidgeting  Eyes: Conjunctivae are normal. PERRL. EOMI. Head: Atraumatic. Nose: No congestion/rhinnorhea. Mouth/Throat: Mucous membranes are dry. Neck: No stridor.  No meningeal signs.   Cardiovascular: Tachycardia, irregular rhythm. Good peripheral circulation. Grossly normal heart sounds. Respiratory: Normal respiratory effort.  No retractions.  Expiratory wheezes throughout lung fields. Gastrointestinal: Soft and nontender. No distention.  Musculoskeletal: No lower extremity tenderness nor edema. No gross deformities of extremities. Neurologic:  Normal speech and language. No gross focal neurologic deficits are appreciated.  Skin:  Skin is warm, dry and intact. No rash noted. Psychiatric: Mood and affect are anxious, aggravated, noncompliant with nursing instructions  ____________________________________________   LABS (all labs ordered are listed, but only abnormal results are displayed)  Labs Reviewed  BASIC METABOLIC PANEL - Abnormal; Notable for the following:       Result Value   Sodium 133 (*)    Chloride 94 (*)    Glucose, Bld 118 (*)    BUN 75 (*)    Creatinine, Ser 1.47 (*)    GFR calc non Af Amer 52 (*)    GFR calc Af Amer 60 (*)    All other components within normal limits  CBC - Abnormal; Notable for the following:    Hemoglobin 10.0 (*)    HCT 33.7 (*)    MCV 70.8 (*)    MCH 21.0 (*)    MCHC 29.7 (*)    RDW 22.7 (*)    All other components within normal limits  TROPONIN I - Abnormal; Notable for the following:    Troponin I 0.04 (*)    All other components within normal limits  BRAIN NATRIURETIC PEPTIDE - Abnormal; Notable for the following:    B Natriuretic Peptide 1,693.0 (*)    All other components within normal limits  URINE DRUG SCREEN, QUALITATIVE ([facility] ONLY)  TROPONIN I   ____________________________________________  EKG  ED ECG  REPORT I, Xana Bradt, the attending physician, personally viewed and interpreted this ECG.  Date: 05/26/2016 EKG Time:  00:13 Rate: 97 Rhythm: Atrial fibrillation QRS Axis: normal Intervals: Nonspecific intraventricular conduction delay ST/T Wave abnormalities: Non-specific ST segment / T-wave changes, but no evidence of acute ischemia. Conduction Disturbances: none Narrative Interpretation: No evidence of acute ischemia  ____________________________________________  RADIOLOGY   Dg Chest 2 View  Result Date: 05/26/2016 CLINICAL DATA:  Initial evaluation for acute chest pain, shortness of breath, weakness. EXAM: CHEST  2 VIEW COMPARISON:  Prior radiograph from 03/16/2016. FINDINGS: Moderate cardiomegaly, stable. Mediastinal silhouette within normal limits. Aortic atherosclerosis noted. Lungs normally inflated. Changes related COPD present. No focal infiltrates. No pulmonary edema or pleural effusion. No pneumothorax. No acute osseus abnormality. IMPRESSION: 1. No active cardiopulmonary disease. 2. Stable cardiomegaly without pulmonary edema. 3. COPD. Electronically Signed   By: Rise Mu M.D.   On: 05/26/2016 00:36    ____________________________________________   PROCEDURES  Critical Care performed: No   Procedure(s) performed:   Procedures   ____________________________________________   INITIAL IMPRESSION / ASSESSMENT AND PLAN / ED COURSE  Pertinent labs & imaging results that were available during my care of the patient were reviewed by me and considered in my medical decision making (see chart for details).  The patient has numerous chronic medical problems and is likely having a mild COPD exacerbation as well.  He is extremely anxious and I think he is also having some symptoms of opiate withdrawal chart compounding the issue.  He also admits to being extremely stressed about his situation at home.  He is hypotensive but looking back at prior visits he  was hypotensive event as well and he does have CHF so I do not want to over pertinent him with fluids.  We are giving him 2 nebs and I will give him Solu-Medrol.  I am giving him a 500 mils normal saline bolus.  Given the concerns about his opioid addiction and his borderline hypotension, I will give him 2.5 mg of IV Haldol and 12.5 mg of IV Benadryl to help him relax.  He has a very slightly elevated troponin but this is also not unexpected and I do not think it represents ACS.  I will check a 4 hour repeat troponin.   Clinical Course as of May 27 306  Thu May 26, 2016  0134 OBSERVATION CARE:  This patient is being placed under observation care for the following reasons: Chest pain with repeat testing to rule out ischemia    [CF]  0300 END OF OBSERVATION STATUS: After an appropriate period of observation, this patient is leaving AMA due to the following reason(s):  not receiving pain medication.  The patient tried to leave with an IV in his arm.  He was spotted by the charge nurse and brought back to his room.  He continues to be anxious and says that he continues to "throb like a toothache" over his body and that he needs something for pain.  With both the charge nurse and his nurse went in the room I discussed with him that I was not going to give narcotics but I would be willing to treat him with nonnarcotic medicines as well as anxiety medicines.  He does not find this acceptable.  I explained to him that I am concerned about his complaints including his chest pain and that I feel he needs additional evaluation and that I cannot definitively rule out an acute problem with his heart or lungs and that he is risking long-term disability or even death by leaving AGAINST MEDICAL ADVICE.  He  states that he understands and he still wants to go and that he will go to [facility] "where they have rehab facilities".  He has capacity to make his own medical decisions and understands the consequences of  leaving AGAINST MEDICAL ADVICE.  I reminded him he can return at any time.  [CF]  0307 At the time of his discharge he is ambulating without difficulty and is in no acute respiratory distress.  He still has tachycardia but he is also still anxious  [CF]    Clinical Course User Index [CF] Loleta Rose, MD    ____________________________________________  FINAL CLINICAL IMPRESSION(S) / ED DIAGNOSES  Final diagnoses:  Chest pain, unspecified type  COPD exacerbation (HCC)  Narcotic withdrawal (HCC)     MEDICATIONS GIVEN DURING THIS VISIT:  Medications  ondansetron (ZOFRAN) injection 4 mg (4 mg Intravenous Given 05/26/16 0056)  ipratropium-albuterol (DUONEB) 0.5-2.5 (3) MG/3ML nebulizer solution 3 mL (3 mLs Nebulization Given 05/26/16 0056)  ipratropium-albuterol (DUONEB) 0.5-2.5 (3) MG/3ML nebulizer solution 3 mL (3 mLs Nebulization Given 05/26/16 0136)  haloperidol lactate (HALDOL) injection 2.5 mg (2.5 mg Intravenous Given 05/26/16 0135)  diphenhydrAMINE (BENADRYL) injection 12.5 mg (12.5 mg Intravenous Given 05/26/16 0135)     NEW OUTPATIENT MEDICATIONS STARTED DURING THIS VISIT:  New Prescriptions   PREDNISONE (DELTASONE) 20 MG TABLET    Take 3 tablets (60 mg total) by mouth daily.    Modified Medications   No medications on file    Discontinued Medications   No medications on file     Note:  This document was prepared using Dragon voice recognition software and may include unintentional dictation errors.    Loleta Rose, MD 05/26/16 [phone]

## 2016-05-26 NOTE — ED Notes (Signed)
Date and time results received: 05/26/16 0058 (use smartphrase ".now" to insert current time)  Test: troponin  Critical Value: 0.04 Name of Provider Notified: Dr. York Cerise   Orders Received? Or Actions Taken?:

## 2016-05-26 NOTE — Discharge Instructions (Signed)
You have been seen in the Emergency Department (ED) today for chest pain and breathing difficulties, as well as pain that may be associated with your narcotics addiction.  We wanted to continue to treat you and do some additional testing, but you are leaving against medical advice.  We encourage you to follow up with your regular doctor as soon as possible, and please return to the emergency department of your choice at any time.  Please follow up with the recommended doctor as instructed above in these documents regarding today?s emergent visit and your recent symptoms to discuss further management.  Continue to take your regular medications. If you are not doing so already, please also take a daily baby aspirin (81 mg), at least until you follow up with your doctor.  Return to the Emergency Department (ED) if you experience any further chest pain/pressure/tightness, difficulty breathing, or sudden sweating, or other symptoms that concern you.

## 2016-05-26 NOTE — ED Notes (Signed)
MD at bedside. 

## 2016-05-26 NOTE — ED Notes (Signed)
Patient transported to X-ray 

## 2016-05-26 NOTE — ED Notes (Signed)
Pt not complying with staying in bed, pt keeps getting up to walk, cords removed and replaced multiple times. Pt asked to stay in bed so he can receive adequate care, pt noncompliant at this time.

## 2016-05-26 NOTE — ED Notes (Signed)
Pt removed all cords, placed clothing back on and walking down hallway. Pt stopped by RN due to IV in arm. MD to room. Pt made aware that he can not leave with IV in arm. Pt educated on treatment options per MD, pt refused. Pt sts he wants to just go home, refusing further treatment. IV removed per RN. Pt signed AMA forms.

## 2016-05-26 NOTE — ED Triage Notes (Signed)
Pt arrived to ED from home by EMS with c/o of chest pain, SOB, weakness x4 days. Pt has Hx of COPD, Afib, CHF. Rhonchi heard bilaterally on assessment. O2 on arrival 98% RA.

## 2016-05-26 NOTE — ED Notes (Signed)
Pt placed in wheelchair, taken to waiting room for ride.

## 2016-06-12 ENCOUNTER — Emergency Department
Admission: EM | Admit: 2016-06-12 | Discharge: 2016-07-08 | Disposition: E | Payer: Medicaid Other | Attending: Emergency Medicine | Admitting: Emergency Medicine

## 2016-06-12 DIAGNOSIS — Z79899 Other long term (current) drug therapy: Secondary | ICD-10-CM | POA: Insufficient documentation

## 2016-06-12 DIAGNOSIS — I469 Cardiac arrest, cause unspecified: Secondary | ICD-10-CM | POA: Diagnosis not present

## 2016-06-12 DIAGNOSIS — R55 Syncope and collapse: Secondary | ICD-10-CM | POA: Diagnosis present

## 2016-06-12 DIAGNOSIS — I10 Essential (primary) hypertension: Secondary | ICD-10-CM | POA: Diagnosis not present

## 2016-06-12 DIAGNOSIS — J449 Chronic obstructive pulmonary disease, unspecified: Secondary | ICD-10-CM | POA: Diagnosis not present

## 2016-06-12 DIAGNOSIS — J45909 Unspecified asthma, uncomplicated: Secondary | ICD-10-CM | POA: Insufficient documentation

## 2016-06-12 DIAGNOSIS — F17200 Nicotine dependence, unspecified, uncomplicated: Secondary | ICD-10-CM | POA: Insufficient documentation

## 2016-06-12 MED ORDER — CALCIUM CHLORIDE 10 % IV SOLN
INTRAVENOUS | Status: DC | PRN
  Administered 2016-06-12: 1 g via INTRAVENOUS

## 2016-06-12 MED ORDER — SODIUM BICARBONATE 8.4 % IV SOLN
INTRAVENOUS | Status: DC | PRN
  Administered 2016-06-12: 50 meq via INTRAVENOUS

## 2016-06-12 MED ORDER — EPINEPHRINE PF 1 MG/10ML IJ SOSY
PREFILLED_SYRINGE | INTRAMUSCULAR | Status: DC | PRN
  Administered 2016-06-12 (×2): 0.1 mg via INTRAVENOUS

## 2016-06-13 MED FILL — Medication: Qty: 1 | Status: AC

## 2016-07-08 DIAGNOSIS — 419620001 Death: Secondary | SNOMED CT | POA: Insufficient documentation

## 2016-07-08 NOTE — ED Notes (Addendum)
Pt belongings given to [employer] , Positive ID with [employer] . Family questioning where vehicle is located.  [address], informed family to touch base with [place] PD to locate and obtain vehicle for safety purposes. Family verbalized understanding.

## 2016-07-08 NOTE — Code Documentation (Signed)
Patient time of death occurred at 16:33

## 2016-07-08 NOTE — Code Documentation (Signed)
Lost pulses,

## 2016-07-08 NOTE — Code Documentation (Addendum)
Post outcome , no jewelry present , set of car keys ,  $205.00 cash , folder of medical information, home medication bottles, clothing cut off pockets checked and disposed in [place]

## 2016-07-08 NOTE — ED Notes (Signed)
Pt arrived CPR in progress , witnessed arrest, asystole ( 3 rounds of epi, 1 narcan , 1 sodium bicarb , 18 Gauge right ac , IO to left lower leg , 1000ml NS fluid,  EMS started code at 15:15,  [facility],  Co2 92

## 2016-07-08 NOTE — ED Notes (Signed)
[employer] next of Kin (Sister)

## 2016-07-08 NOTE — ED Provider Notes (Addendum)
[facility] Emergency Department Provider Note   ____________________________________________   First MD Initiated Contact with Patient Jul 02, 2016 1637     (approximate)  I have reviewed the triage vital signs and the nursing notes.   HISTORY  Chief Complaint No chief complaint on file.   HPI Kirk Cortez is a 56 y.o. male with multiple medical conditions including CHF and atrial fibrillation who is presenting to the emergency department today after a witnessed arrest. Per EMS, the patient had difficulty standing and then lost consciousness. CPR was initiated once EMS arrived. EMS started left tibial I O as well as a right antecubital fossa 18-gauge Angiocath.3 rounds of epi were given as well as 1 round of bicarbonate. Initial rhythm was asystole. At one point the patient also was found to be in PEA.  The Samuel Bouche machine was used for CPR en route.   Past Medical History:  Diagnosis Date  . Asthma   . Chronic atrial fibrillation (HCC)    a. 2018 Anticoagulation d/c'd in setting of GIB.  Marland Kitchen Chronic systolic CHF (congestive heart failure) (HCC)    a. 02/2016 Echo: EF 20-25%, sev MR, mod-sev dil LA, RV dysfxn, mild to mod TR, Sev PAH, mod dil RA.  Marland Kitchen COPD (chronic obstructive pulmonary disease) (HCC)   . GI bleed    a. 01/2016 - Required 7units PRBCs.  . History of bialteral pulmonary emboli   . History of bilateral DVT (deep vein thrombosis)   . Hyperlipidemia   . Hypertension   . Mitral regurgitation    a. 02/2016 Echo: Mod-sev MR.  Marland Kitchen NICM (nonischemic cardiomyopathy) (HCC)    a. 02/2016 Echo: EF 20-25%.  . Non-obstructive CAD (coronary artery disease)    a. 07/2011 PTE CT: EF 22%, Cor Ca2+ involving LAD, RCA, LCX, moderate apical, basal ant, basal antlat, basal antsept, mid ant, mid antlat, and mid antsept ischemia;  b. 07/2011 Cath: LAD 60ost, 70.  . Polysubstance abuse    a. 40 pack year h/o tobacco abuse; b. uses cocaine and buys opiates "off the street" -  says recently he's been using suboxone.    Patient Active Problem List   Diagnosis Date Noted  . Acute on chronic systolic CHF (congestive heart failure) (HCC) 03/18/2016  . Elevated troponin 03/18/2016  . Hyponatremia 03/18/2016  . Alcohol abuse 03/18/2016  . Cocaine abuse 03/18/2016  . Tobacco abuse counseling 03/18/2016  . Paroxysmal ventricular tachycardia (HCC) 03/18/2016  . Moderate mitral regurgitation 03/18/2016  . Moderate tricuspid regurgitation 03/18/2016  . Moderate to severe pulmonary hypertension (HCC) 03/18/2016  . Atrial fibrillation with rapid ventricular response (HCC)   . Seizure (HCC)   . Substance abuse   . Symptomatic anemia   . Dilated cardiomyopathy (HCC)   . Syncope   . Melena   . Acute post-hemorrhagic anemia   . Hypotension 03/16/2016  . Pneumonia of right lower lobe due to infectious organism (HCC) 02/01/2016  . History of pulmonary embolism 01/30/2016  . Acute encephalopathy 01/14/2016  . Opiate dependence (HCC) 09/17/2015  . 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 Acute respiratory failure with hypoxia (HCC) 09/16/2015  . Community acquired pneumonia 09/15/2015  . CAP (community acquired pneumonia) 09/15/2015  . CHF (congestive heart failure) (HCC) 08/16/2015  . Bilateral pulmonary embolism (HCC) 06/12/2015  . DVT, bilateral lower limbs (HCC) 06/12/2015  . Mixed anxiety depressive disorder 01/14/2015  . Chronic obstructive pulmonary disease (HCC) 04/29/2013  . Low back pain 04/29/2013  . Shortness of breath 04/10/2013  .  Coronary atherosclerosis of native coronary artery 10/06/2011  . Essential hypertension 10/06/2011  . Hyperlipidemia 10/06/2011  . Tobacco use disorder 08/05/2011  . Chest pain 08/04/2011    Past Surgical History:  Procedure Laterality Date  . APPENDECTOMY    . HERNIA REPAIR      Prior to Admission medications   Medication Sig Start Date End Date Taking? Authorizing Provider  albuterol (ACCUNEB) 1.25 MG/3ML  nebulizer solution Take 1.25 mg by nebulization every 6 (six) hours as needed. 08/06/15 08/05/16  [provider]  atorvastatin (LIPITOR) 80 MG tablet Take 80 mg by mouth every evening. 08/06/15   [provider]  doxycycline (VIBRA-TABS) 100 MG tablet Take 1 tablet (100 mg total) by mouth every 12 (twelve) hours. 03/18/16   Katharina Caper, MD  folic acid (FOLVITE) 1 MG tablet Take 1 tablet (1 mg total) by mouth daily. 03/19/16   Katharina Caper, MD  furosemide (LASIX) 80 MG tablet Take 80 mg by mouth 2 (two) times daily.    [provider]  ipratropium (ATROVENT HFA) 17 MCG/ACT inhaler Inhale 2 puffs into the lungs every 6 (six) hours.    [provider]  ipratropium (ATROVENT) 0.02 % nebulizer solution Take 2.5 mLs (0.5 mg total) by nebulization every 6 (six) hours as needed for wheezing or shortness of breath. 03/18/16   Katharina Caper, MD  lisinopril (PRINIVIL,ZESTRIL) 2.5 MG tablet Take 2.5 mg by mouth daily.    [provider]  magnesium oxide (MAG-OX) 400 MG tablet Take 400 mg by mouth 2 (two) times daily. 07/02/15 07/01/16  [provider]  metolazone (ZAROXOLYN) 2.5 MG tablet Take 2.5 mg by mouth daily.    [provider]  metoprolol succinate (TOPROL-XL) 25 MG 24 hr tablet Take 12.5 mg by mouth daily.    [provider]  Multiple Vitamin (MULTIVITAMIN WITH MINERALS) TABS tablet Take 1 tablet by mouth daily. 03/19/16   Katharina Caper, MD  nicotine (NICODERM CQ - DOSED IN MG/24 HOURS) 14 mg/24hr patch Place 1 patch (14 mg total) onto the skin daily. 03/19/16   Katharina Caper, MD  nitroGLYCERIN (NITROSTAT) 0.4 MG SL tablet Place 0.4 mg under the tongue.    [provider]  pantoprazole (PROTONIX) 40 MG tablet Take 40 mg by mouth 2 (two) times daily.    [provider]  predniSONE (DELTASONE) 20 MG tablet Take 3 tablets (60 mg total) by mouth daily. 05/26/16   Loleta Rose, MD  sertraline (ZOLOFT) 50 MG tablet Take 50  mg by mouth daily.    [provider]  thiamine 100 MG tablet Take 1 tablet (100 mg total) by mouth daily. 03/19/16   Katharina Caper, MD  tiotropium (SPIRIVA) 18 MCG inhalation capsule Place 18 mcg into inhaler and inhale daily.    [provider]    Allergies Patient has no known allergies.  Family History  Problem Relation Age of Onset  . Cancer Mother   . Heart attack Father     Social History Social History  Substance Use Topics  . Smoking status: Current Every Day Smoker    Packs/day: 1.00    Years: 40.00  . Smokeless tobacco: Never Used  . Alcohol use Yes     Comment: "3 drinks/week"    Review of Systems  Level V caveat secondary to patient unresponsive.  ____________________________________________   PHYSICAL EXAM:  VITAL SIGNS: ED Triage Vitals  Enc Vitals Group     BP 2016-07-03 1626 (!) 180/104  Pulse Rate 2016-07-04 1625 (!) 110     Resp --      Temp --      Temp src --      SpO2 --      Weight --      Height --      Head Circumference --      Peak Flow --      Pain Score --      Pain Loc --      Pain Edu? --      Excl. in GC? --     Constitutional: GCS of 3. Fixed and dilated pupils. Eyes: Conjunctivae are normal.  Head: Ecchymosis about the left eye from a reported fall when the patient lost consciousness just prior to arrival. Nose: No congestion/rhinnorhea. Mouth/Throat: King airway in place. Neck: No stridor.   Cardiovascular: Initially without any groin nor carotid pulses. Respiratory: Not breathing spontaneously and requiring bagging with a King airway. Gastrointestinal: Mild distention. Musculoskeletal: Bilateral lower extremity edema.  Left lower extremity intraosseous line which is wrapped with gauze. Clean dry and intact. Neurologic:  GCS of 3 Skin:  Skin is warm, dry and intact. No rash noted.   ____________________________________________   LABS (all labs ordered are listed, but only abnormal results are  displayed)  Labs Reviewed - No data to display ____________________________________________  EKG  ED ECG REPORT I, Lessie Funderburke,  Teena Irani, the attending physician, personally viewed and interpreted this ECG.   Date: Jul 04, 2016  EKG Time: 1625  Rate: 115  Rhythm: Sinus tachycardia versus a wide complex junctional rhythm.  Axis: Normal  Intervals:Wide QRS  ST&T Change: Inferior as well as lateral ST depression. No ST elevation. T-wave inversions in lead 3 as well as 2. Appears to have ST elevation in aVR.  ____________________________________________  RADIOLOGY   ____________________________________________   PROCEDURES  Procedure(s) performed:  Cardiopulmonary Resuscitation (CPR) Procedure Note Directed/Performed by: Arelia Longest I personally directed ancillary staff and/or performed CPR in an effort to regain return of spontaneous circulation and to maintain cardiac, neuro and systemic perfusion. \   Procedures  Critical Care performed:  CRITICAL CARE Performed by: Arelia Longest   Total critical care time: 25 minutes  Critical care time was exclusive of separately billable procedures and treating other patients.  Critical care was necessary to treat or prevent imminent or life-threatening deterioration.  Critical care was time spent personally by me on the following activities: development of treatment plan with patient and/or surrogate as well as nursing, discussions with consultants, evaluation of patient's response to treatment, examination of patient, obtaining history from patient or surrogate, ordering and performing treatments and interventions, ordering and review of laboratory studies, ordering and review of radiographic studies, pulse oximetry and re-evaluation of patient's condition.   ____________________________________________   INITIAL IMPRESSION / ASSESSMENT AND PLAN / ED COURSE  Pertinent labs & imaging results that were available  during my care of the patient were reviewed by me and considered in my medical decision making (see chart for details).  Patient was given additional rounds of epinephrine in the emergency department. Also given calcium and bicarbonate here as well. Rhythms included asystole, PEA, pulseless V. tach, ventricular fibrillation. Multiple shocks given and we did regain pulses on 2 occasions but the patient lost pulses within seconds to minutes each time. Patient's terminal rhythm was asystole. A bedside ultrasound was performed and no organized cardiac activity was observed. Time of death was called.  ----------------------------------------- 5:05 PM on  06-Apr-2016 -----------------------------------------  Patient does not have any family listed on his contact information. Unclear power of attorney. I called his friend that is listed, Jaclynn Guarnerihyllis Davis, who says that he does have the contact information of family but he will have to look for. The patient's friend will be reporting to the hospital.     ____________________________________________   FINAL CLINICAL IMPRESSION(S) / ED DIAGNOSES  CPR. Cardiac arrest.    NEW MEDICATIONS STARTED DURING THIS VISIT:  New Prescriptions   No medications on file     Note:  This document was prepared using Dragon voice recognition software and may include unintentional dictation errors.    Myrna BlazerSchaevitz, Kolbie Matthew, MD 2016/11/03 1706  Initial blood glucose taken in the field was over 100.    Myrna BlazerSchaevitz, Laterrian Matthew, MD 2016/11/03 [phone]

## 2016-07-08 NOTE — Code Documentation (Signed)
Pt with pulses

## 2016-07-08 DEATH — deceased

## 2017-04-15 IMAGING — US US CAROTID DUPLEX BILAT
1 series · 13 of 24 positions shown · non-contrast
Comparison: None.

CLINICAL DATA: Cerebral infarction. Hypertension, COPD, tobacco
abuse.

EXAM:
BILATERAL CAROTID DUPLEX ULTRASOUND
TECHNIQUE: Gray scale imaging, color Doppler and duplex ultrasound was
performed of bilateral carotid and vertebral arteries in the neck.
TECHNIQUE: Quantification of carotid stenosis is based on velocity parameters
that correlate the residual internal carotid diameter with
NASCET-based stenosis levels, using the diameter of the distal
internal carotid lumen as the denominator for stenosis measurement.

[Series 1: us carotid duplex bilat · 0.05mm/px · 13 of 64 slices shown]
[im 1/64]
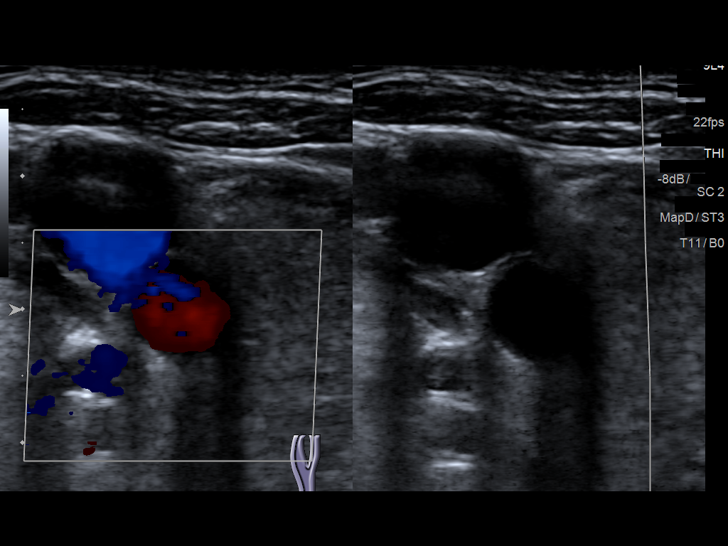
[im 6/64]
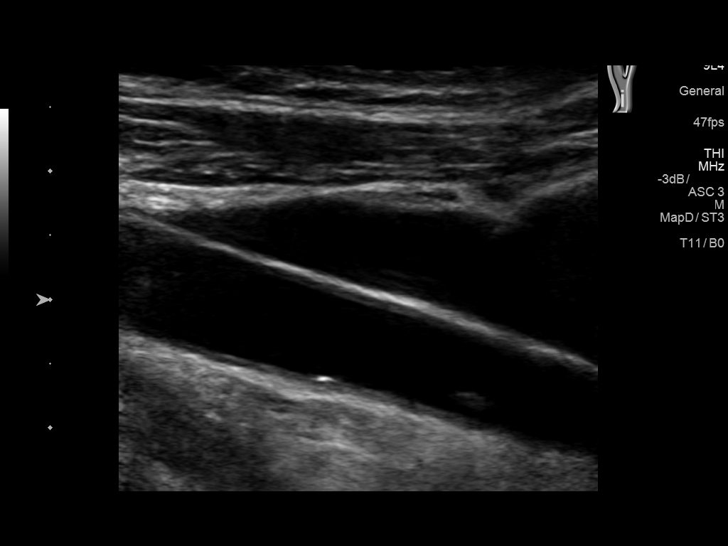
[im 11/64]
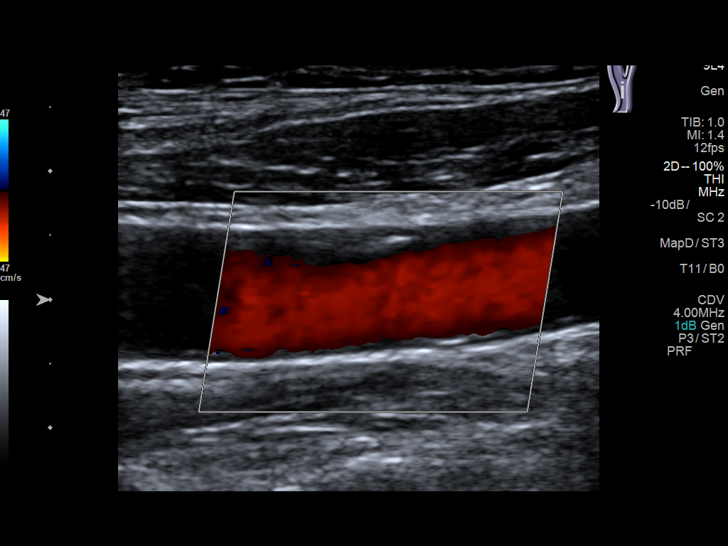
[im 17/64]
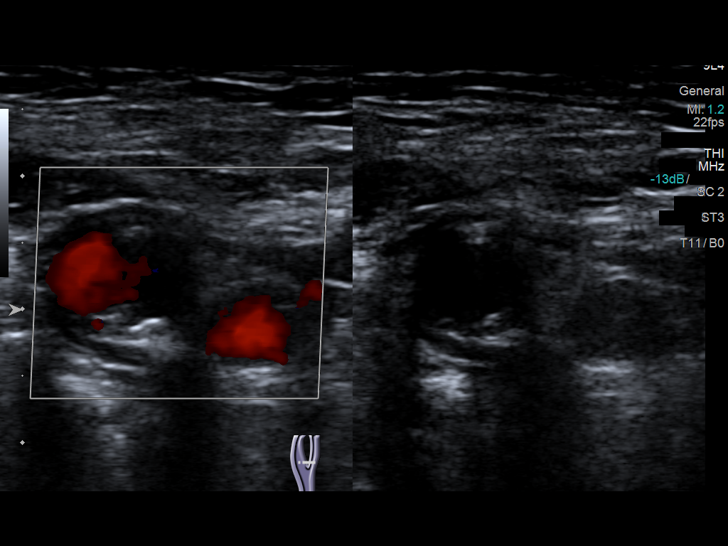
[im 22/64]
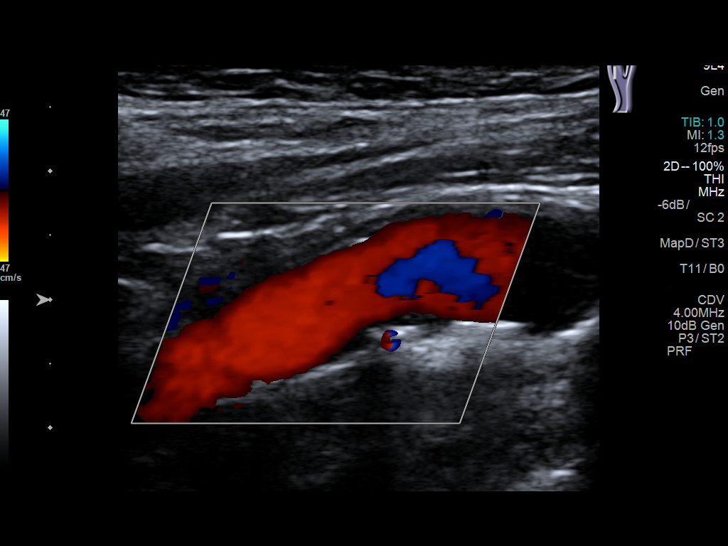
[im 28/64]
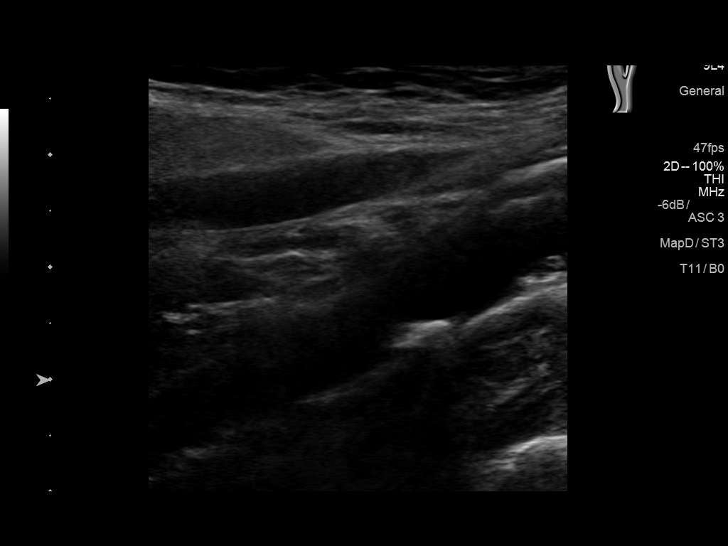
[im 33/64]
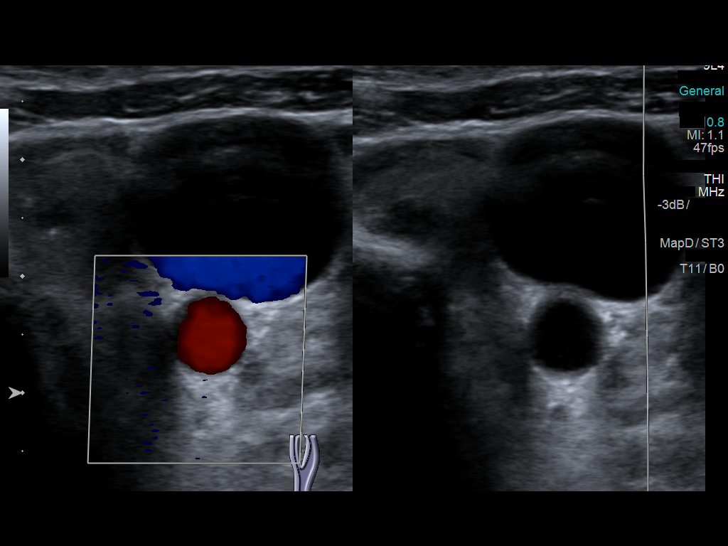
[im 36/64]
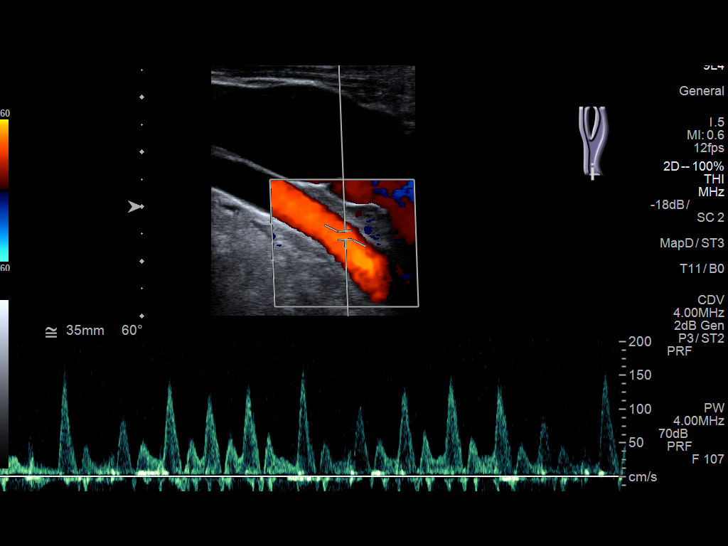
[im 42/64]
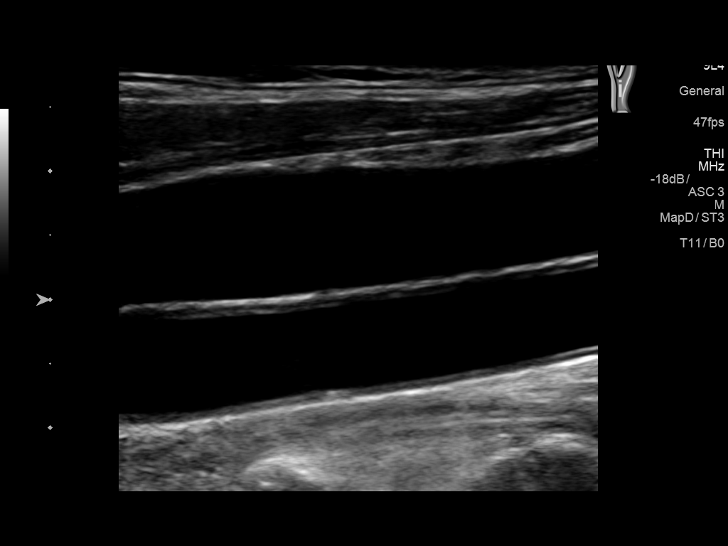
[im 47/64]
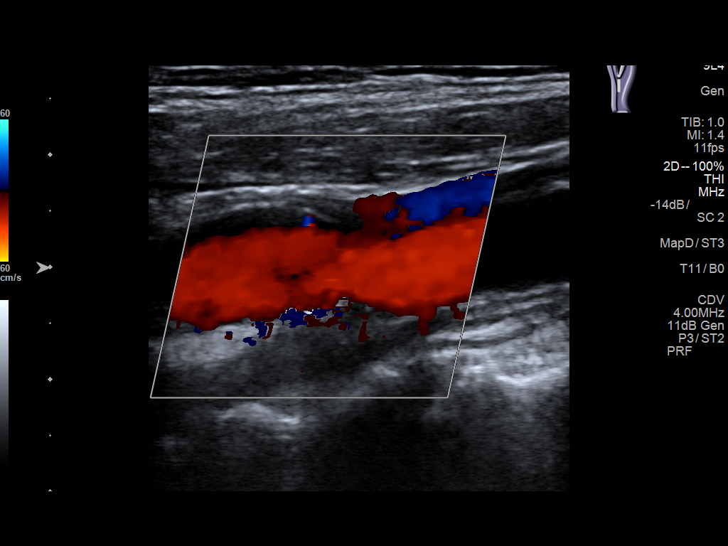
[im 53/64]
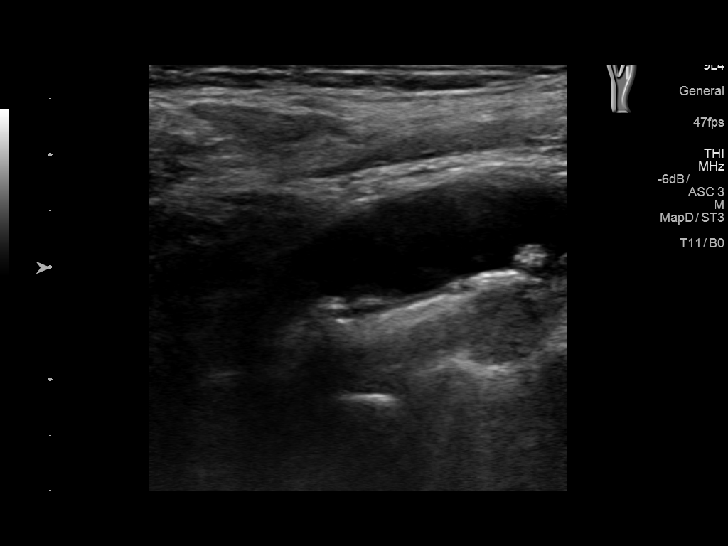
[im 58/64]
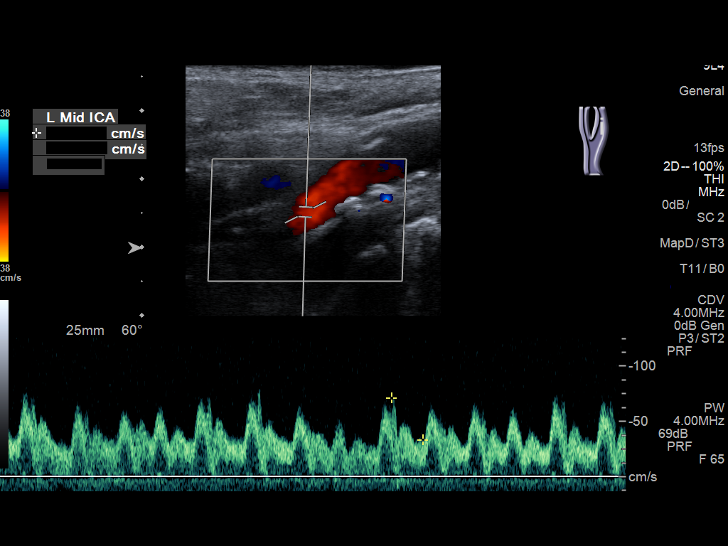
[im 64/64]
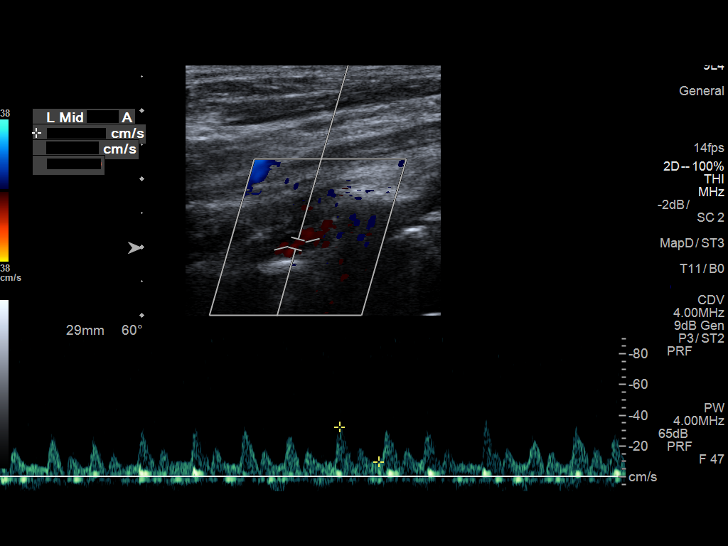

[13 of 24 positions shown; findings below may reference images not displayed]

The following velocity measurements were obtained:

PEAK SYSTOLIC/END DIASTOLIC

RIGHT

ICA:                     69/32cm/sec

CCA:                     114/23cm/sec

SYSTOLIC ICA/CCA RATIO:

DIASTOLIC ICA/CCA RATIO:

ECA:                     109cm/sec

LEFT

ICA:                     81/32cm/sec

CCA:                     174/33cm/sec

SYSTOLIC ICA/CCA RATIO:

DIASTOLIC ICA/CCA RATIO:

ECA:                     109cm/sec
FINDINGS: RIGHT CAROTID ARTERY: Scattered calcified plaque through the common
carotid artery. Eccentric more heavily calcified plaque in the
carotid bulb extending into proximal internal and external carotid
artery origins. No high-grade stenosis. Normal waveforms and color
Doppler signal.

RIGHT VERTEBRAL ARTERY:  Normal flow direction and waveform.

LEFT CAROTID ARTERY: Mild eccentric scratch the mild eccentric
partially calcified plaque in the carotid bulb and proximal ICA. No
high-grade stenosis. Normal waveforms and color Doppler signal.

LEFT VERTEBRAL ARTERY: Normal flow direction and waveform.
IMPRESSION: 1. Bilateral carotid bifurcation and proximal ICA plaque, right
greater than left, resulting in less than 50% diameter stenosis.
2.  Antegrade bilateral vertebral arterial flow.
